# Patient Record
Sex: Female | Born: 1975 | Race: White | Hispanic: No | Marital: Married | Smoking: Never smoker
Health system: Southern US, Community
[De-identification: ages and names within clinical notes are randomized; demographics above are authoritative.]

## PROBLEM LIST (undated history)

## (undated) DIAGNOSIS — O24419 Gestational diabetes mellitus in pregnancy, unspecified control: Secondary | ICD-10-CM

## (undated) DIAGNOSIS — O09529 Supervision of elderly multigravida, unspecified trimester: Secondary | ICD-10-CM

## (undated) DIAGNOSIS — T4145XA Adverse effect of unspecified anesthetic, initial encounter: Secondary | ICD-10-CM

## (undated) DIAGNOSIS — T8859XA Other complications of anesthesia, initial encounter: Secondary | ICD-10-CM

## (undated) HISTORY — PX: NO PAST SURGERIES: SHX2092

---

## 2003-01-18 DIAGNOSIS — O09299 Supervision of pregnancy with other poor reproductive or obstetric history, unspecified trimester: Secondary | ICD-10-CM

## 2005-01-16 ENCOUNTER — Inpatient Hospital Stay: Payer: Self-pay

## 2009-11-28 ENCOUNTER — Encounter: Payer: Self-pay | Admitting: Obstetrics and Gynecology

## 2010-02-11 ENCOUNTER — Encounter: Payer: Self-pay | Admitting: Pediatric Cardiology

## 2010-02-19 IMAGING — US US OB NUCHAL TRANSLUCENCY 1ST GEST - [facility] NRPT
1 series · 14 of 23 positions shown · non-contrast
Comparison: none

[Series 1: us ob nuchal translucency 1st gest - [facility] nrpt · 14 of 23 slices shown]
[im 1/23]
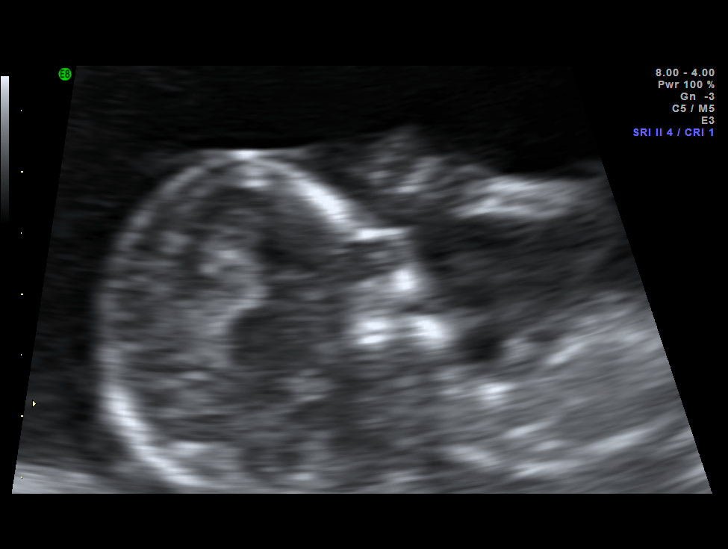
[im 3/23]
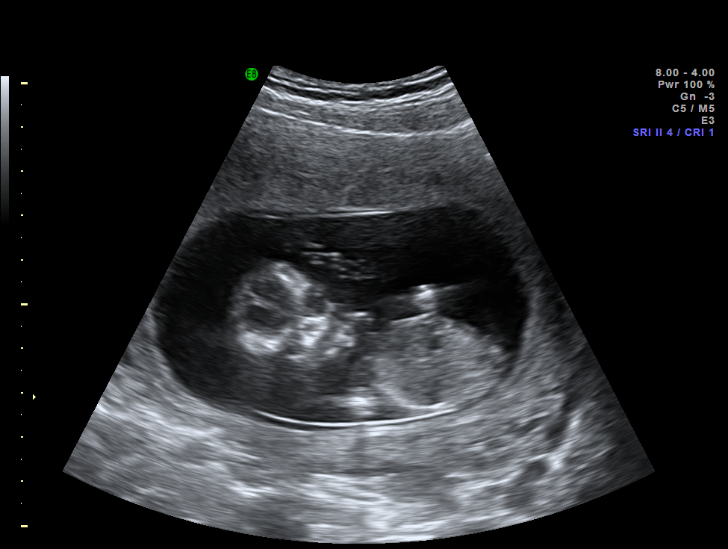
[im 5/23]
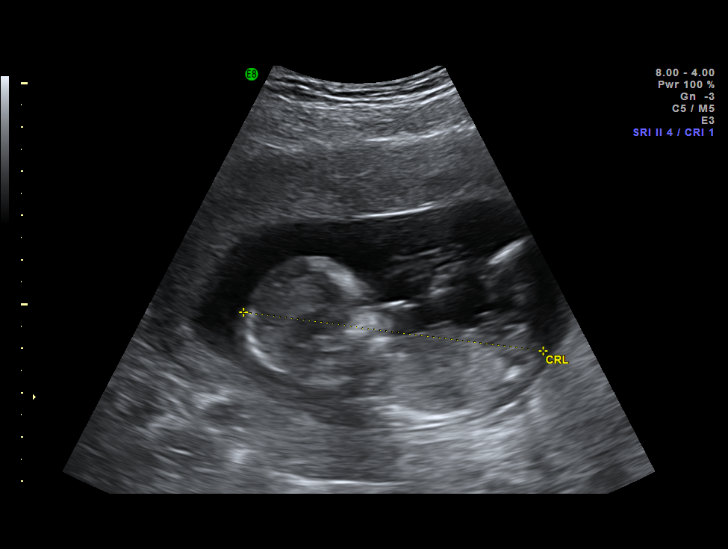
[im 6/23]
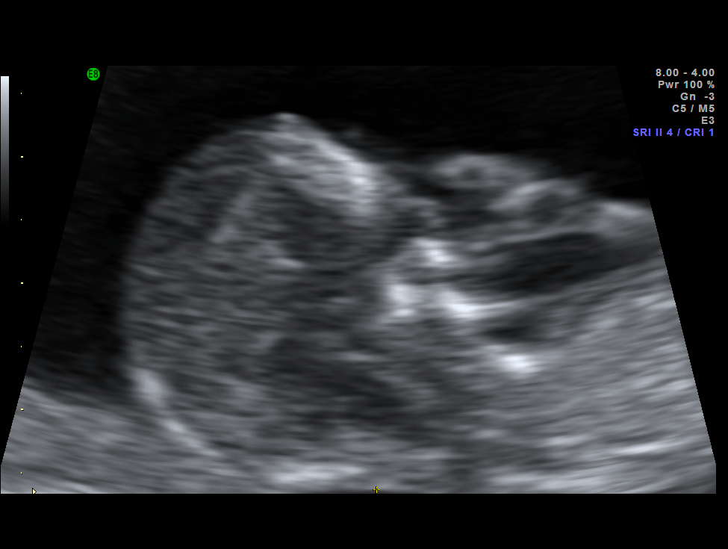
[im 8/23]
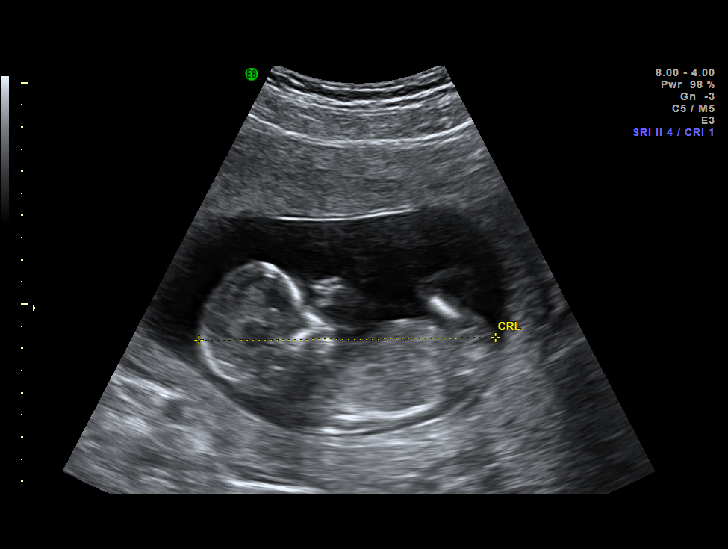
[im 10/23]
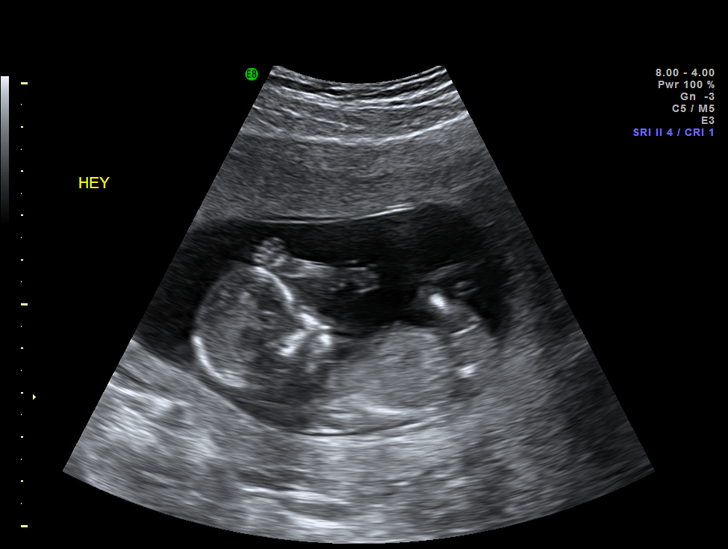
[im 11/23]
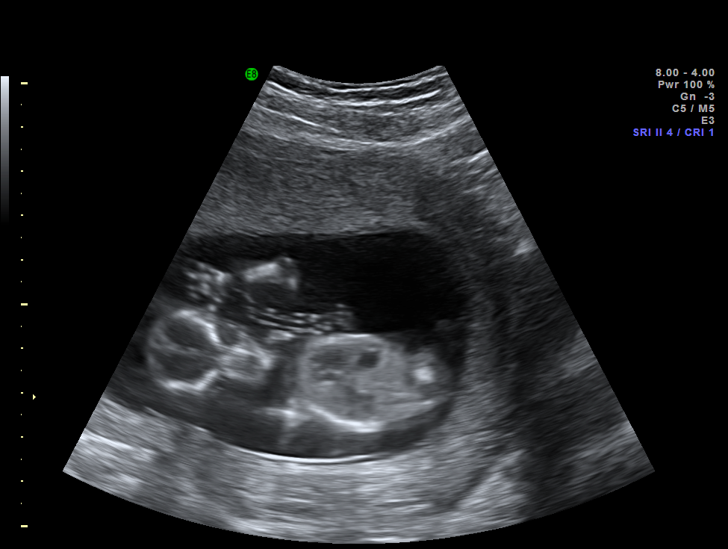
[im 13/23]
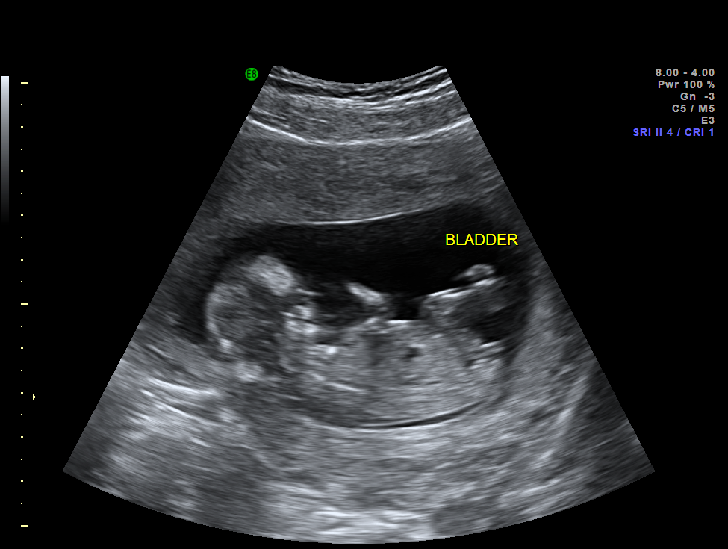
[im 14/23]
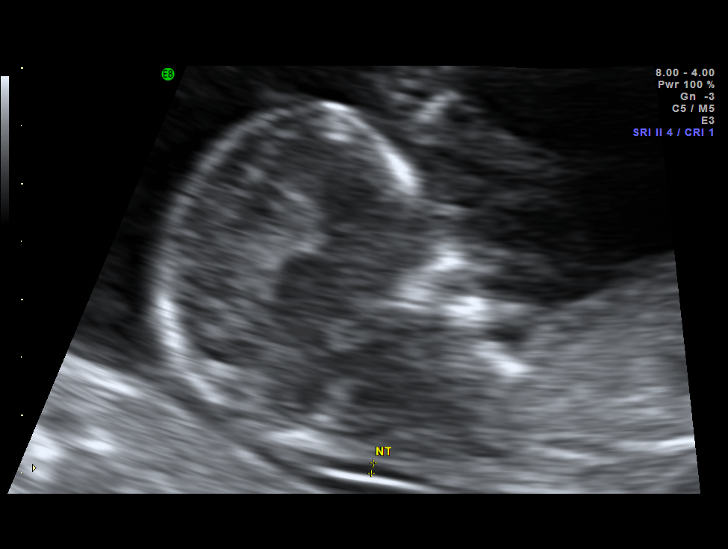
[im 16/23]
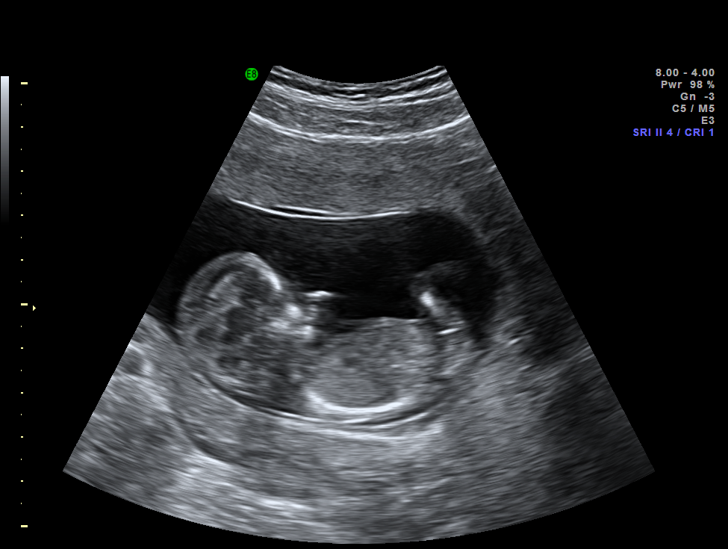
[im 18/23]
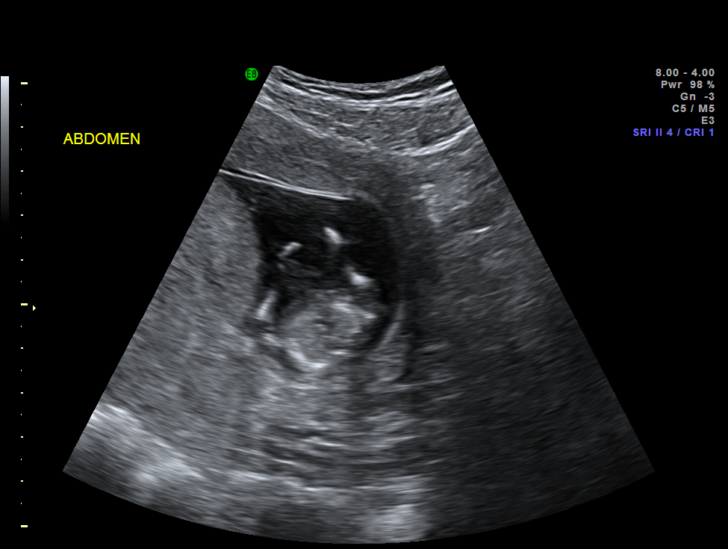
[im 19/23]
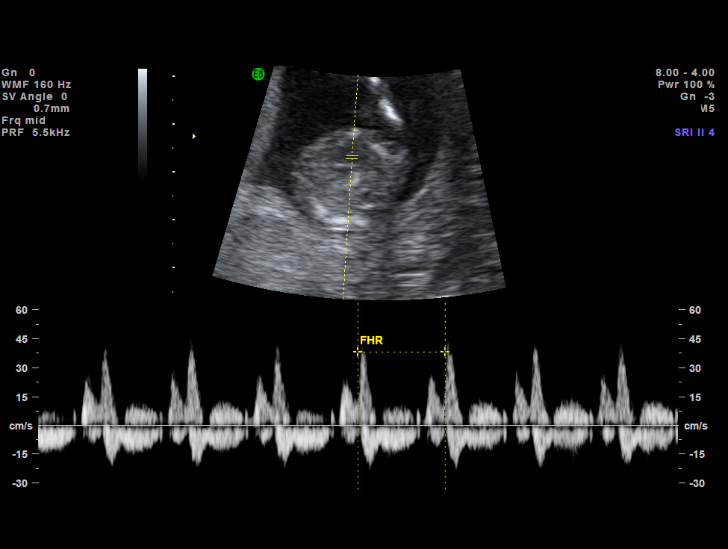
[im 21/23]
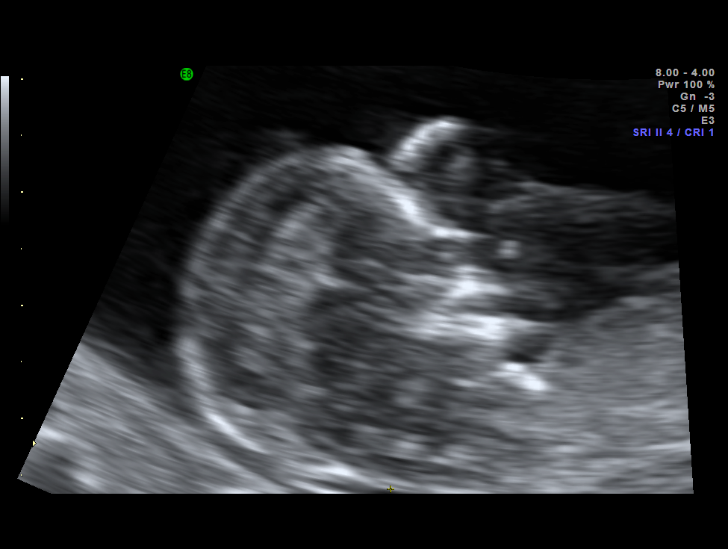
[im 23/23]
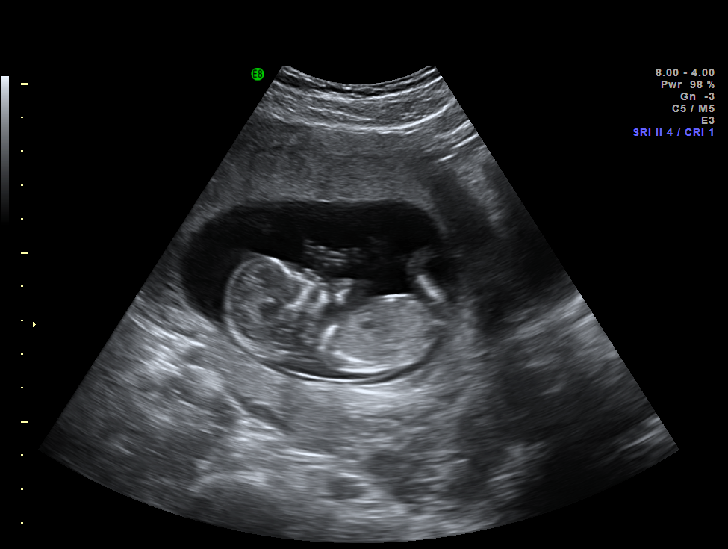

[14 of 23 positions shown; findings below may reference images not displayed]

IMAGES IMPORTED FROM THE SYNGO WORKFLOW SYSTEM
NO DICTATION FOR STUDY

## 2010-03-04 ENCOUNTER — Encounter: Payer: Self-pay | Admitting: Pediatric Cardiology

## 2010-04-14 ENCOUNTER — Ambulatory Visit: Payer: Self-pay

## 2010-05-06 ENCOUNTER — Observation Stay: Payer: Self-pay | Admitting: Obstetrics and Gynecology

## 2010-05-10 ENCOUNTER — Observation Stay: Payer: Self-pay | Admitting: Obstetrics and Gynecology

## 2010-05-21 ENCOUNTER — Inpatient Hospital Stay: Payer: Self-pay | Admitting: Obstetrics and Gynecology

## 2010-05-21 DIAGNOSIS — O09299 Supervision of pregnancy with other poor reproductive or obstetric history, unspecified trimester: Secondary | ICD-10-CM

## 2011-10-13 ENCOUNTER — Ambulatory Visit: Payer: Self-pay | Admitting: Obstetrics and Gynecology

## 2012-01-04 IMAGING — MG MAM DGTL DIAGNOSTIC MAMMO W/CAD
1 series · 6 of 6 positions shown · non-contrast
Comparison: none

REASON FOR EXAM: lt brst pain all over
COMMENTS:

[Series 6993: R CC · right · 6 of 6 slices shown]
[im 1/6]
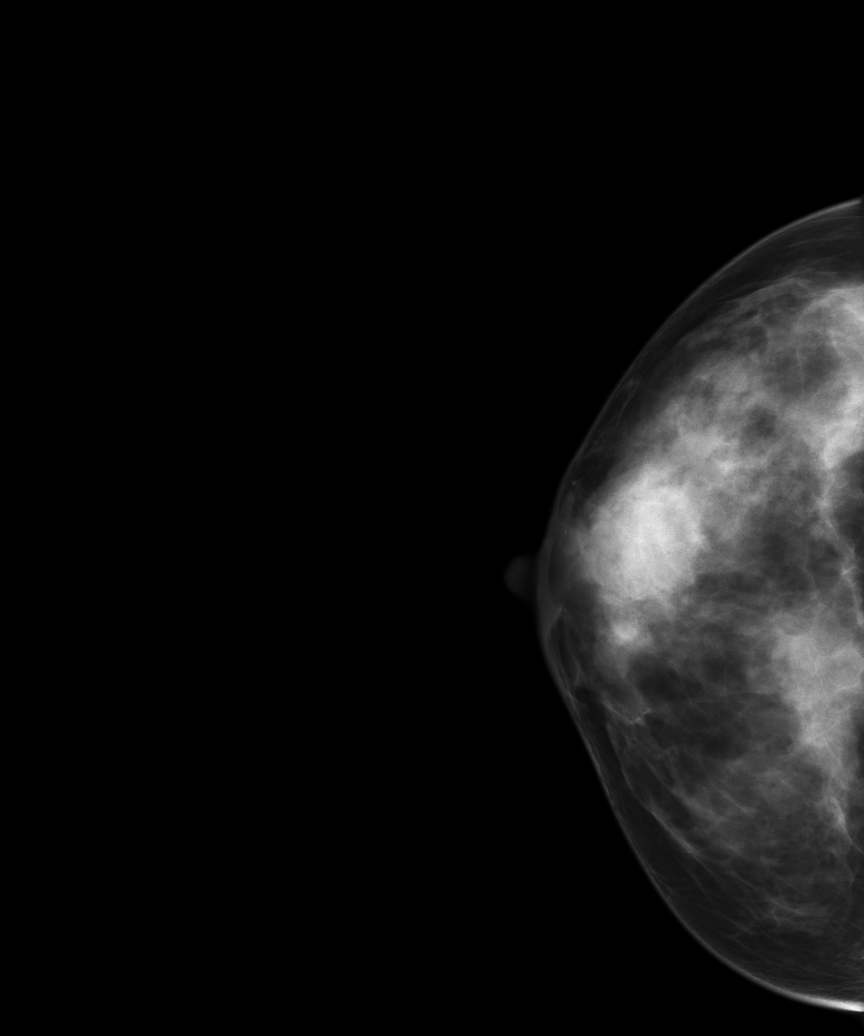
[im 2/6]
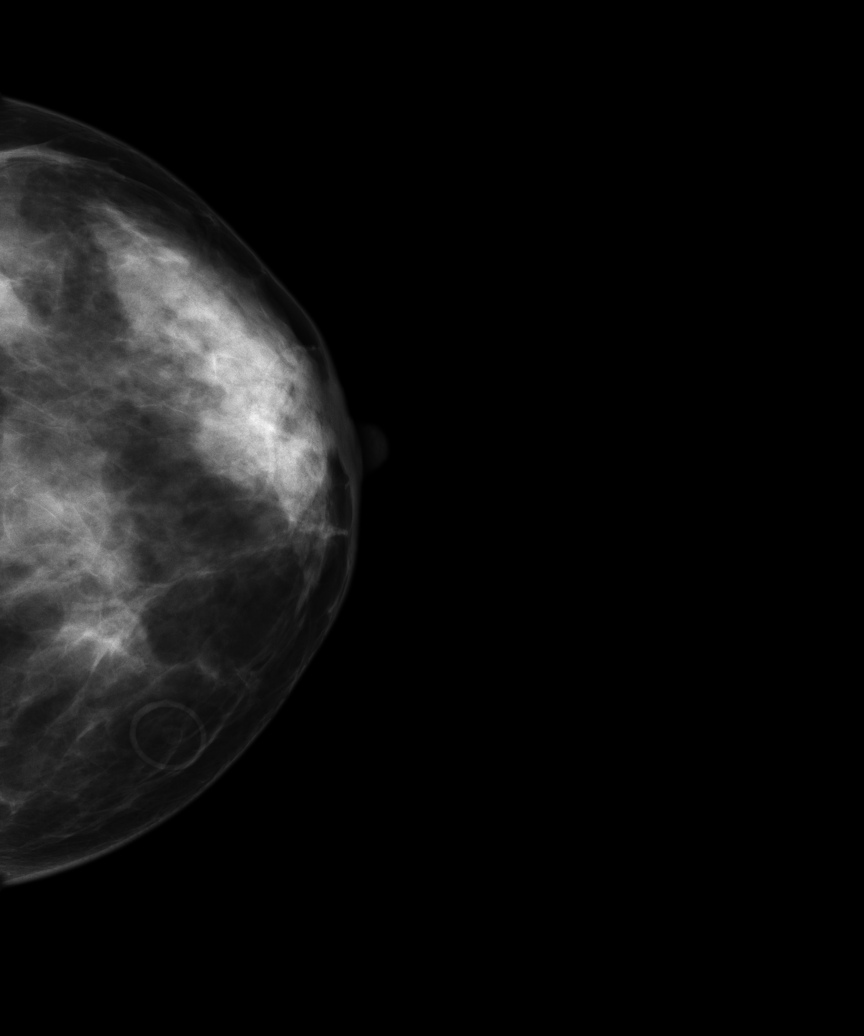
[im 3/6]
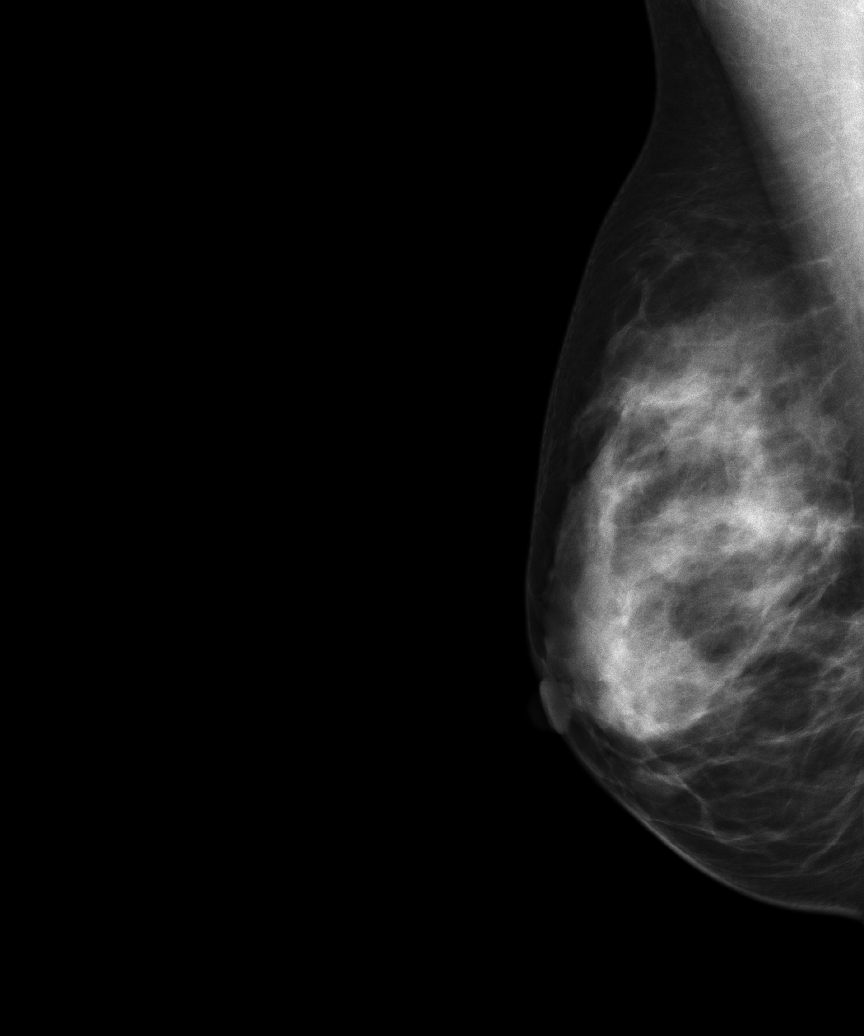
[im 4/6]
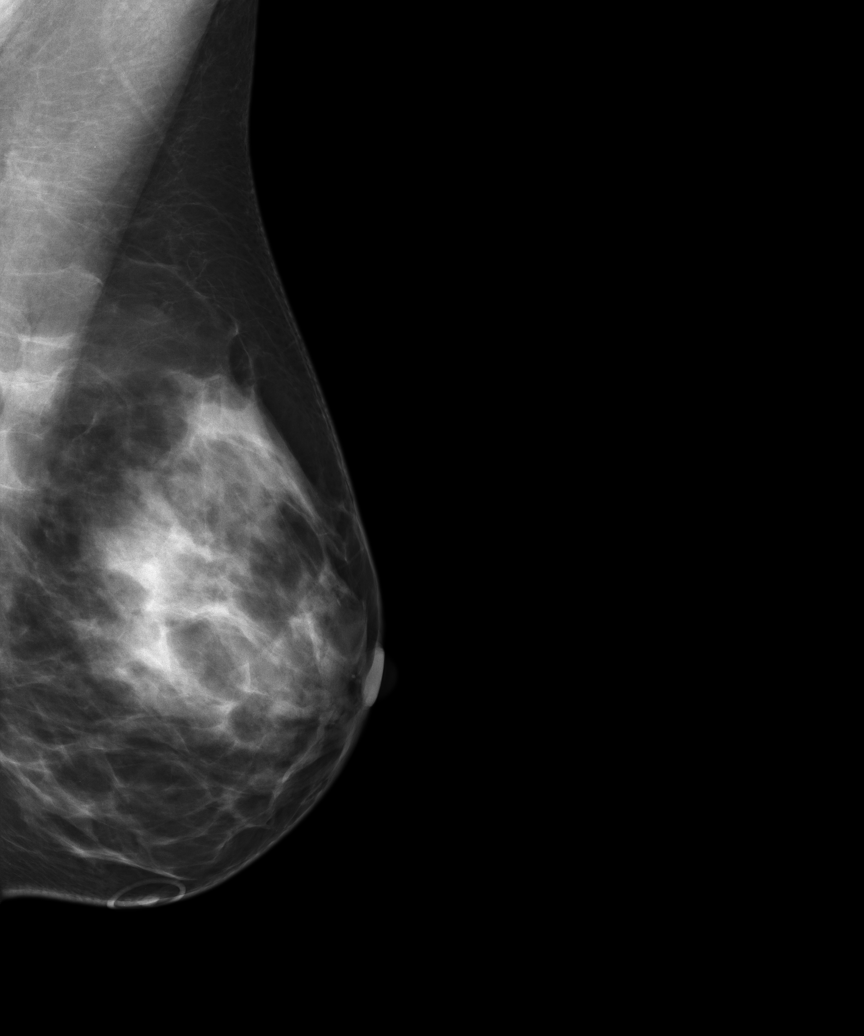
[im 5/6]
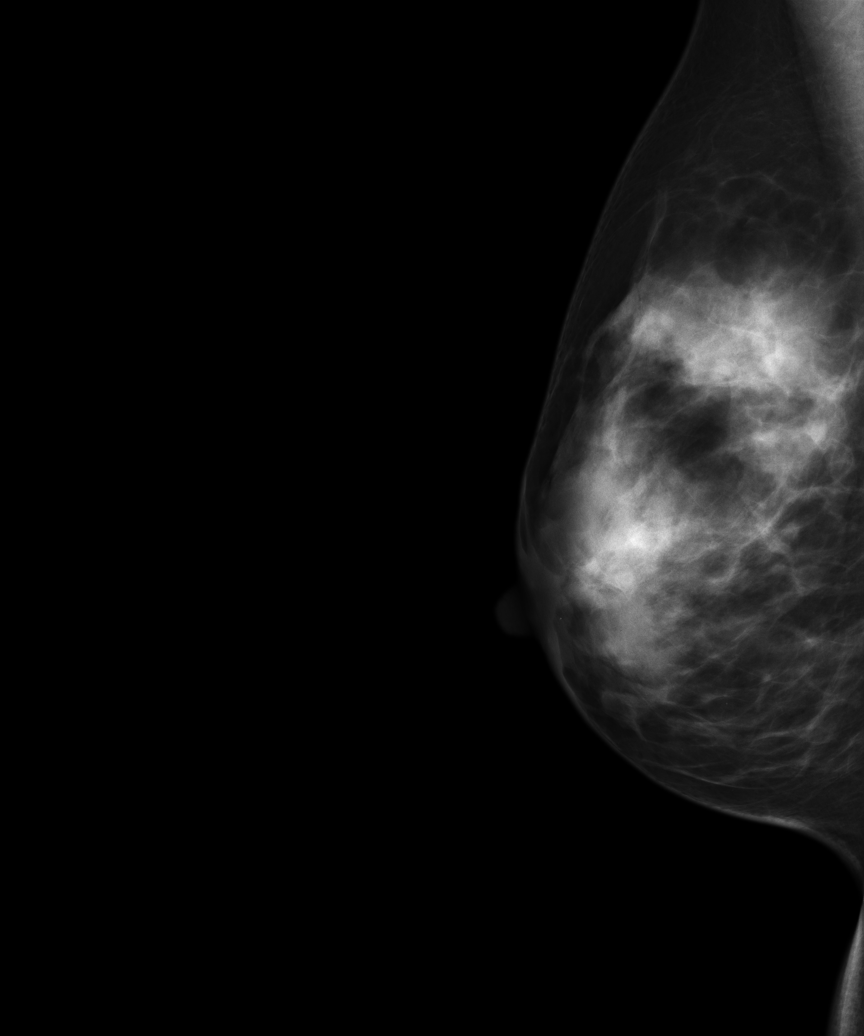
[im 6/6]
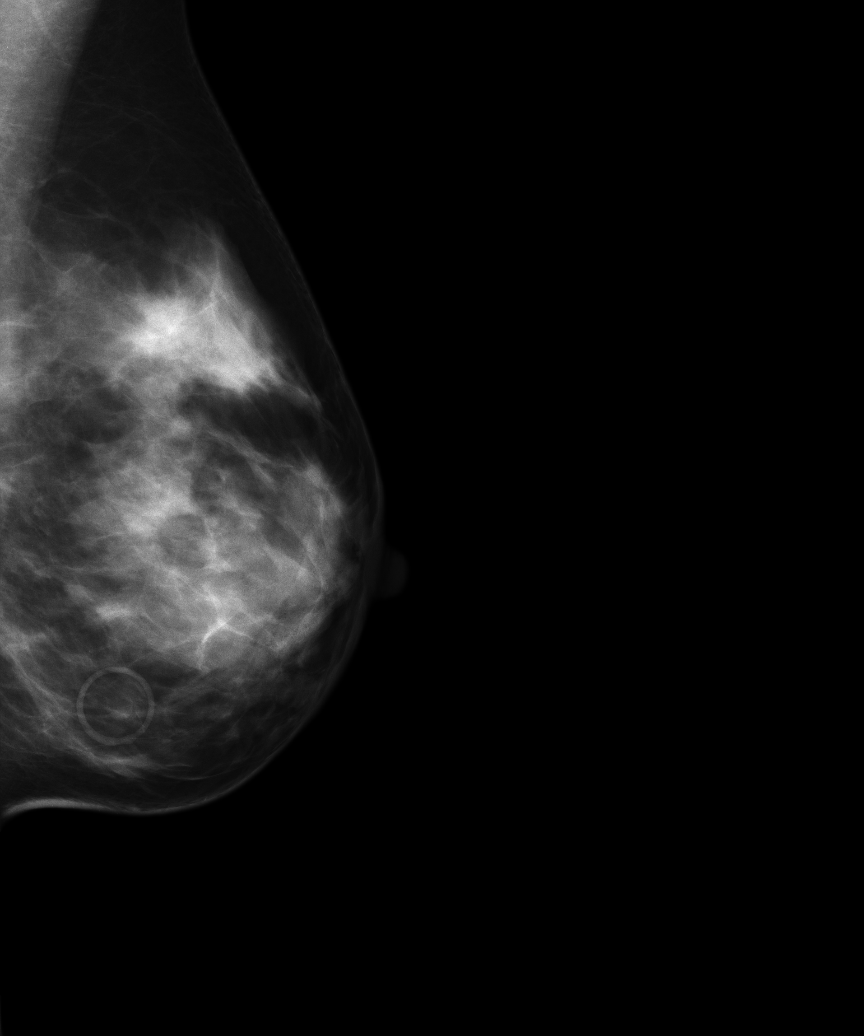

[6 of 6 positions shown; findings below may reference images not displayed]

PROCEDURE:     MAM - MAM DGTL DIAGNOSTIC MAMMO W/CAD  - October 13, 2011  [DATE]

RESULT:     This is a baseline study.

The patient is reporting generalized left breast discomfort when lying on
her stomach.

The breasts exhibit a heterogeneously dense, nodular parenchymal pattern. A
skin nevus was marked inferomedially on the left. There is no dominant mass.
There is no malignant appearing grouping of microcalcification. No area of
architectural distortion or skin thickening is seen.
IMPRESSION: 1.The breasts exhibit a normal, moderately dense, nodular parenchymal
pattern. I see no finding to suggest malignancy.

BI-RADS: Category 1 - Negative

RECOMMENDATIONS:

 Continued close clinical follow-up is recommended. If there are palpable
findings now or in the future, or if the patient's symptoms become more
focal, consideration could be given for performing a directed ultrasound of
the area of concern. Follow-up mammography in one year also could be
considered rather than waiting the typical five years until age 40.

A NEGATIVE MAMMOGRAM REPORT DOES NOT PRECLUDE BIOPSY OR OTHER EVALUATION OF
A CLINICALLY PALPABLE OR OTHERWISE SUSPICIOUS MASS OR LESION. BREAST CANCER
MAY NOT BE DETECTED BY MAMMOGRAPHY IN UP TO 10% OF CASES.

## 2014-09-26 ENCOUNTER — Ambulatory Visit: Payer: Self-pay | Admitting: Obstetrics and Gynecology

## 2014-11-23 DIAGNOSIS — O24419 Gestational diabetes mellitus in pregnancy, unspecified control: Secondary | ICD-10-CM

## 2014-11-23 HISTORY — DX: Gestational diabetes mellitus in pregnancy, unspecified control: O24.419

## 2015-07-30 ENCOUNTER — Other Ambulatory Visit: Payer: Self-pay | Admitting: Obstetrics and Gynecology

## 2015-07-30 DIAGNOSIS — Z3491 Encounter for supervision of normal pregnancy, unspecified, first trimester: Secondary | ICD-10-CM

## 2015-07-31 LAB — OB RESULTS CONSOLE HGB/HCT, BLOOD
HCT: 34 %
HEMOGLOBIN: 12.7 g/dL

## 2015-07-31 LAB — OB RESULTS CONSOLE HIV ANTIBODY (ROUTINE TESTING): HIV: NONREACTIVE

## 2015-07-31 LAB — OB RESULTS CONSOLE PLATELET COUNT: Platelets: 177 10*3/uL

## 2015-07-31 LAB — OB RESULTS CONSOLE ABO/RH: RH Type: POSITIVE

## 2015-07-31 LAB — OB RESULTS CONSOLE RUBELLA ANTIBODY, IGM: RUBELLA: IMMUNE

## 2015-07-31 LAB — OB RESULTS CONSOLE GC/CHLAMYDIA
CHLAMYDIA, DNA PROBE: NEGATIVE
GC PROBE AMP, GENITAL: NEGATIVE

## 2015-07-31 LAB — OB RESULTS CONSOLE ANTIBODY SCREEN: Antibody Screen: NEGATIVE

## 2015-07-31 LAB — OB RESULTS CONSOLE VARICELLA ZOSTER ANTIBODY, IGG: VARICELLA IGG: IMMUNE

## 2015-08-22 ENCOUNTER — Ambulatory Visit (HOSPITAL_BASED_OUTPATIENT_CLINIC_OR_DEPARTMENT_OTHER)
Admission: RE | Admit: 2015-08-22 | Discharge: 2015-08-22 | Disposition: A | Discharge status: Home / Self Care | Payer: 59 | Source: Ambulatory Visit | Attending: Obstetrics and Gynecology | Admitting: Obstetrics and Gynecology

## 2015-08-22 ENCOUNTER — Ambulatory Visit
Admission: RE | Admit: 2015-08-22 | Discharge: 2015-08-22 | Disposition: A | Discharge status: Home / Self Care | Payer: 59 | Source: Ambulatory Visit | Attending: Obstetrics and Gynecology | Admitting: Obstetrics and Gynecology

## 2015-08-22 DIAGNOSIS — O09521 Supervision of elderly multigravida, first trimester: Secondary | ICD-10-CM

## 2015-08-22 DIAGNOSIS — Z3491 Encounter for supervision of normal pregnancy, unspecified, first trimester: Secondary | ICD-10-CM | POA: Diagnosis present

## 2015-08-22 DIAGNOSIS — Z3A12 12 weeks gestation of pregnancy: Secondary | ICD-10-CM | POA: Insufficient documentation

## 2015-08-22 DIAGNOSIS — O09529 Supervision of elderly multigravida, unspecified trimester: Secondary | ICD-10-CM | POA: Insufficient documentation

## 2015-08-22 HISTORY — DX: Adverse effect of unspecified anesthetic, initial encounter: T41.45XA

## 2015-08-22 HISTORY — DX: Supervision of elderly multigravida, unspecified trimester: O09.529

## 2015-08-22 HISTORY — DX: Other complications of anesthesia, initial encounter: T88.59XA

## 2015-08-22 HISTORY — DX: Gestational diabetes mellitus in pregnancy, unspecified control: O24.419

## 2015-08-22 LAB — US OP OB COMP LESS 14 WKS

## 2015-08-22 NOTE — Progress Notes (Signed)
Referring Provider:   [facility] Length of Consultation: 40 minute consultation  Ms. Radliff was referred to [facility] for genetic counseling because of advanced maternal age.  The patient will be 39 years old at the time of delivery.  This note summarizes the information we discussed.    We explained that the chance of a chromosome abnormality increases with maternal age.  Chromosomes and examples of chromosome problems were reviewed.  Humans typically have 46 chromosomes in each cell, with half passed through each sperm and egg.  Any change in the number or structure of chromosomes can increase the risk of problems in the physical and mental development of a pregnancy.   Based upon age of the patient, the chance of any chromosome abnormality was 1 in 42. The chance of Down syndrome, the most common chromosome problem associated with maternal age, was 1 in 77.  The risk of chromosome problems is in addition to the 3% general population risk for birth defects and mental retardation.  The greatest chance, of course, is that the baby would be born in good health.  We discussed the following prenatal screening and testing options for this pregnancy:  First trimester screening, which includes nuchal translucency ultrasound screen and first trimester maternal serum marker screening.  The nuchal translucency has approximately an 80% detection rate for Down syndrome and can be positive for other chromosome abnormalities as well as heart defects.  When combined with a maternal serum marker screening, the detection rate is up to 90% for Down syndrome and up to 97% for trisomy 18.     The chorionic villus sampling procedure is available for first trimester chromosome analysis.  This involves the withdrawal of a small amount of chorionic villi (tissue from the developing placenta).  Risk of pregnancy loss is estimated to be approximately 1 in 200 to 1 in 100 (0.5 to 1%).  There is  approximately a 1% (1 in 100) chance that the CVS chromosome results will be unclear.  Chorionic villi cannot be tested for neural tube defects.     Maternal serum marker screening, a blood test that measures pregnancy proteins, can provide risk assessments for Down syndrome, trisomy 18, and open neural tube defects (spina bifida, anencephaly). Because it does not directly examine the fetus, it cannot positively diagnose or rule out these problems.  Targeted ultrasound uses high frequency sound waves to create an image of the developing fetus.  An ultrasound is often recommended as a routine means of evaluating the pregnancy.  It is also used to screen for fetal anatomy problems (for example, a heart defect) that might be suggestive of a chromosomal or other abnormality.   Amniocentesis involves the removal of a small amount of amniotic fluid from the sac surrounding the fetus with the use of a thin needle inserted through the maternal abdomen and uterus.  Ultrasound guidance is used throughout the procedure.  Fetal cells from amniotic fluid are directly evaluated and > 99.5% of chromosome problems and > 98% of open neural tube defects can be detected. This procedure is generally performed after the 15th week of pregnancy.  The main risks to this procedure include complications leading to miscarriage in less than 1 in 200 cases (0.5%).  We also reviewed the availability of cell free fetal DNA testing from maternal blood to determine whether or not the baby may have either Down syndrome, trisomy 77, or trisomy 36.  This test utilizes a maternal blood sample and  DNA sequencing technology to isolate circulating cell free fetal DNA from maternal plasma.  The fetal DNA can then be analyzed for DNA sequences that are derived from the three most common chromosomes involved in aneuploidy, chromosomes 13, 18, and 21.  If the overall amount of DNA is greater than the expected level for any of these chromosomes,  aneuploidy is suspected.  While we do not consider it a replacement for invasive testing and karyotype analysis, a negative result from this testing would be reassuring, though not a guarantee of a normal chromosome complement for the baby.  An abnormal result is certainly suggestive of an abnormal chromosome complement, though we would still recommend CVS or amniocentesis to confirm any findings from this testing.  Cystic Fibrosis screening was also discussed with the patient. Cystic fibrosis (CF) is one of the most common genetic conditions in persons of Caucasian ancestry.  This condition occurs in approximately 1 in 2,500 Caucasian persons and results in thickened secretions in the lungs, digestive, and reproductive systems.  For a baby to be at risk for having CF, both of the parents must be carriers for this condition.  Approximately 1 in 51 Caucasian persons is a carrier for CF.  Current carrier testing looks for the most common mutations in the gene for CF and can detect approximately 90% of carriers in the Caucasian population.  This means that the carrier screening can greatly reduce, but cannot eliminate, the chance for an individual to have a child with CF.  If an individual is found to be a carrier for CF, then carrier testing would be available for the partner. As part of [place]'s newborn screening profile, all babies born in the state of [place] will have a two-tier screening process.  Specimens are first tested to determine the concentration of immunoreactive trypsinogen (IRT).  The top 5% of specimens with the highest IRT values then undergo DNA testing using a panel of over 40 common CF mutations.   We obtained a detailed family history and pregnancy history.  The family history is unremarkable for birth defects, mental retardation, recurrent pregnancy loss or known chromosome abnormalities.  The patient reported that her father and paternal grandmother both were diagnosed with  benign brain tumors.  Most brain tumors occur by chance, but some are the result of genetic syndromes.  Most of these syndromes would be expected to have other symptoms associated with them that would have brought about a medical diagnosis.  In the absence of a known genetic syndrome, there appears to be a slight increased chance for benign brain tumors in relatives of persons with them.  However, no specific genetic cause is known.  The father of the baby stated that his maternal aunt and maternal grandmother both had breast cancer in their 47s.  We discussed that while most cases of breast cancer occur sporadically, 5-10% of breast cancer is expected to be due to inherited genetic changes. Early age at diagnosis, multiple affected family members and people with other similar types of cancer would be suspicious for an inherited predisposition.  If additional concern arises, a referral for the family to a cancer genetic counselor could be considered.    Ms. Amy reported no complications or exposures which would be expected to increase the risk for birth defects.   After consideration of the options, Ms. Pulido elected to proceed with InformaSeq testing and an ultrasound today.  The couple declined CF carrier screening.  An ultrasound was performed at the  time of the visit.  The gestational age was consistent with 12 weeks.  Fetal anatomy could not be assessed due to early gestational age.  Please refer to the ultrasound report for details of that study. We do recommend a detailed anatomy ultrasound in the second trimester.  Ms. Lacks was encouraged to call with questions or concerns.  We can be contacted at [phone].   Cherly Anderson, MS, CGC

## 2015-08-22 NOTE — Progress Notes (Signed)
I met with the pt and agree with plan as outlined in counselors note for NIPT  Monica Griffin

## 2015-08-28 LAB — INFORMASEQ(SM) WITH XY ANALYSIS
Fetal Fraction (%):: 17.4
Fetal Number: 1
GESTATIONAL AGE AT COLLECTION: 12.5 wk
Weight: 132 [lb_av]

## 2015-08-29 ENCOUNTER — Telehealth: Payer: Self-pay | Admitting: Obstetrics and Gynecology

## 2015-08-29 NOTE — Telephone Encounter (Signed)
The patient was informed of the results of her recent InformaSeq testing (performed at [facility]) which yielded NEGATIVE results.  The patient's specimen showed DNA consistent with two copies of chromosomes 21, 18 and 13.  The sensitivity for trisomy 1, trisomy 23 and trisomy 71 using this testing are reported as 99.1%, 98.3% and 98.1% respectively.  Thus, while the results of this testing are highly accurate, they are not considered diagnostic.  Should more definitive information be desired, the patient may still consider amniocentesis.   As requested by the patient, sex chromosome analysis was included for this sample.  Results were placed in a sealed envelope and picked up by the father of the baby, per patient request. The accuracy of sex chromosome determination is >97%.  A maternal serum AFP only should be considered if screening for neural tube defects is desired.  Cherly Anderson, MS, CGC

## 2015-11-05 ENCOUNTER — Encounter: Payer: 59 | Attending: Obstetrics and Gynecology | Admitting: Dietician

## 2015-11-05 VITALS — BP 98/56 | Ht 64.0 in | Wt 140.9 lb

## 2015-11-05 DIAGNOSIS — O24419 Gestational diabetes mellitus in pregnancy, unspecified control: Secondary | ICD-10-CM | POA: Diagnosis present

## 2015-11-05 DIAGNOSIS — O24410 Gestational diabetes mellitus in pregnancy, diet controlled: Secondary | ICD-10-CM

## 2015-11-05 NOTE — Progress Notes (Signed)
Diabetes Self-Management Education  Visit Type: First/Initial  Appt. Start Time: 1025 Appt. End Time: 1225  11/05/2015  Ms. Markiah Janeway, identified by name and date of birth, is a 39 y.o. female with a diagnosis of Diabetes: Gestational Diabetes.   ASSESSMENT  Blood pressure 98/56, height  (1.626 m), weight 140 lb 14.4 oz (63.912 kg), last menstrual period 05/26/2015. Body mass index is 24.17 kg/(m^2).  Pt c/o feeling shaky just prior to appointment with BG  62 (2 hr pp-ate cold cereal and milk for breakfast)- ate only almonds after checking BG Lacks knowledge of diabetes care and diet BG's not in control Pt has instructions about starting N and R insulins but does not have prescriptions for them (?? Dosages of N & R-our note from MD with referral has different dosages for N & R from dosages on patient's paperwork) Pt has been taking Glyburide 2.5 mg one tablet TID before every meal (MD note states to take 1 tablet in AM and 2 tablets in PM)     Diabetes Self-Management Education - 11/05/15 1304    Visit Information   Visit Type First/Initial   Initial Visit   Diabetes Type Gestational Diabetes   Psychosocial Assessment   Patient Belief/Attitude about Diabetes Motivated to manage diabetes   Self-care barriers None   Other persons present Patient   Patient Concerns Glycemic Control;Healthy Lifestyle  prevent  complications with pregnancy   Special Needs None   Preferred Learning Style Hands on;Visual;Auditory   Learning Readiness Ready   What is the last grade level you completed in school? 16+   Complications   How often do you check your blood sugar? --  2-4x/day   Fasting Blood glucose range (mg/dL) 16-109   Postprandial Blood glucose range (mg/dL) --  60-454   Have you had a dilated eye exam in the past 12 months? Yes   Have you had a dental exam in the past 12 months? Yes   Are you checking your feet? Yes   How many days per week are you checking your feet? 7    Dietary Intake   Breakfast --  eats 3 meals/day-often eats cold cereal and milk for breakfast   Snack (morning) --  eats almonds, cheese stick, fruit or protein bar   Snack (afternoon) --  eats almonds, cheese stick, fruit or protein bar   Dinner --  meal time varies 6-8pm   Snack (evening) --  eats occasional bedtime snack of cereal and milk   Beverage(s) --  drinks 8+ glasses of water daily   Exercise   Exercise Type --  walks daily   How many days per week to you exercise? 7   How many minutes per day do you exercise? 30   Total minutes per week of exercise 210   Patient Education   Previous Diabetes Education Yes (please comment)  in 2004 with GDM with previous pregnancy   Disease state  --  discussed GDM and importance of tight BG control to promote healthy outcome with pregnancy and prevent complications-viewed GDM video and provided booklet on GDM   Nutrition management  Role of diet in the treatment of diabetes and the relationship between the three main macronutrients and blood glucose level;Food label reading, portion sizes and measuring food.;Carbohydrate counting   Physical activity and exercise  --  discussed role of exercise in managing diabetes and appropriate options for exercise   Medications Reviewed use of Glyburide-Taught/reviewed insulin injection, site rotation, insulin storage and  needle disposal.;Reviewed patients medication for diabetes, action, purpose, timing of dose and side effects.  instructed on mixing N and R insulins if prescribed   Acute complications Taught treatment of hypoglycemia the 15 rule-BG 62 prior to visit and pt feeling shaky-10:20am gave pt 1 glucose tablet and rechecked BG 76 at 11am-pt feeling much better; at end of visit pt c/o feeling little shaky again and rechecked BG with results 65-ate 1 glucose tablet and advised to eat lunch as soon as possible   Psychosocial adjustment Role of stress on diabetes;Identified and addressed  patients feelings and concerns about diabetes;Worked with patient to identify barriers to care and solutions;Helped patient identify a support system for diabetes management   Preconception care Pregnancy and GDM  Role of pre-pregnancy blood glucose control on the development of the fetus;Role of family planning for patients with diabetes;Reviewed with patient blood glucose goals with pregnancy   Personal strategies to promote health Lifestyle issues that need to be addressed for better diabetes care;Helped patient develop diabetes management plan for (enter comment)      Individualized Plan for Diabetes Self-Management Training:   Learning Objective:  Patient will have a greater understanding of diabetes self-management. Patient education plan is to attend individual and/or group sessions per assessed needs and concerns.   Patient Instructions  Read booklet on Gestational Diabetes Follow Gestational Meal Planning Guidelines Complete a 3 Day Food Record and bring to next appointment Check blood sugars 4 x day - before breakfast and 2 hrs after every meal and record  Bring blood sugar log to next appointment Continue to walk 30 minutes at least 5 x week if permitted by MD Ask MD for clarification on medications/insulins at next visit Next appointment    11-20-15 Eat lunch as soon as possible Take Glyburide as directed by MD  Education material provided: General Meal Planning Guidelines for Healthy Pregnancy, GDM booklet, GDM video  If problems or questions, patient to contact team via:  [phone]  Future DSME appointment:  11-20-15

## 2015-11-05 NOTE — Patient Instructions (Signed)
Read booklet on Gestational Diabetes Follow Gestational Meal Planning Guidelines Complete a 3 Day Food Record and bring to next appointment Check blood sugars 4 x day - before breakfast and 2 hrs after every meal and record  Bring blood sugar log to next appointment Continue to walk 30 minutes at least 5 x week if permitted by MD Ask MD for clarification on medications at next visit Next appointment    11-20-15

## 2015-11-11 ENCOUNTER — Telehealth: Payer: Self-pay | Admitting: Dietician

## 2015-11-11 NOTE — Telephone Encounter (Signed)
Called pt x2 at home and work numbers-no answer x2- left messages x2 on voice mails for pt to call me with update

## 2015-11-13 ENCOUNTER — Ambulatory Visit (INDEPENDENT_AMBULATORY_CARE_PROVIDER_SITE_OTHER): Payer: 59 | Admitting: Obstetrics and Gynecology

## 2015-11-13 ENCOUNTER — Encounter: Payer: Self-pay | Admitting: Obstetrics and Gynecology

## 2015-11-13 VITALS — BP 98/63 | HR 77 | Wt 142.7 lb

## 2015-11-13 DIAGNOSIS — Z3493 Encounter for supervision of normal pregnancy, unspecified, third trimester: Secondary | ICD-10-CM

## 2015-11-13 DIAGNOSIS — O24419 Gestational diabetes mellitus in pregnancy, unspecified control: Secondary | ICD-10-CM

## 2015-11-13 LAB — POCT URINALYSIS DIPSTICK
BILIRUBIN UA: NEGATIVE
Blood, UA: NEGATIVE
KETONES UA: NEGATIVE
LEUKOCYTES UA: NEGATIVE
Nitrite, UA: NEGATIVE
Spec Grav, UA: 1.015
Urobilinogen, UA: 0.2
pH, UA: 6

## 2015-11-13 MED ORDER — GLYBURIDE 2.5 MG PO TABS: 2.5000 mg | ORAL_TABLET | Freq: Two times a day (BID) | ORAL | Status: DC

## 2015-11-13 NOTE — Progress Notes (Signed)
[facility], pt has GDM has already seen Lifestyle

## 2015-11-13 NOTE — Progress Notes (Signed)
Transfer OB from [facility]- doing well except sugars are high 30% of time, is able to correlate with diet, currently taking 2.5 mg gliburide in am and 5mg  in pm. Will schedule growth scan at [redacted] weeks along with cervical length (2 previous LEEP) reviewed OB history [id number] with all three vaginal deliveries at 37 or 38 weeks due to labor or induction due to GDM.

## 2015-11-14 ENCOUNTER — Encounter: Payer: Self-pay | Admitting: *Deleted

## 2015-11-14 ENCOUNTER — Encounter: Payer: Self-pay | Admitting: Obstetrics and Gynecology

## 2015-11-20 ENCOUNTER — Encounter: Payer: 59 | Admitting: Dietician

## 2015-11-20 VITALS — BP 94/50 | Ht 64.0 in | Wt 144.0 lb

## 2015-11-20 DIAGNOSIS — O24410 Gestational diabetes mellitus in pregnancy, diet controlled: Secondary | ICD-10-CM

## 2015-11-20 DIAGNOSIS — O24419 Gestational diabetes mellitus in pregnancy, unspecified control: Secondary | ICD-10-CM | POA: Diagnosis not present

## 2015-11-20 NOTE — Progress Notes (Signed)
   Patient's BG record indicates BGs showing some fluctuation; FBGs ranging from 79-109, some of which are related to forgetting to take Glyburide.               Post-meal BGs ranging 56-209 with one additional reading of 226. Patient relates elevated BGs to overeating or stress, some related to excess carb. Intake.   Patient's food diary indicates no completed food diary; oral report of intake indicates generally good control of Carb. Intake and balanced meals.    Provided 1900kcal meal plan, and wrote individualized menus based on patient's food preferences.  Instructed patient on food safety, including avoidance of Listeriosis, and limiting mercury from fish.  Discussed importance of maintaining healthy lifestyle habits to reduce risk of Type 2 DM as well as Gestational DM with any future pregnancies.  Advised patient to use any remaining testing supplies to test some BGs after delivery, and to have BG tested ideally annually, as well as prior to attempting future pregnancies.

## 2015-11-21 ENCOUNTER — Ambulatory Visit: Payer: 59 | Admitting: Dietician

## 2015-11-27 ENCOUNTER — Ambulatory Visit (INDEPENDENT_AMBULATORY_CARE_PROVIDER_SITE_OTHER): Payer: 59 | Admitting: Obstetrics and Gynecology

## 2015-11-27 ENCOUNTER — Encounter: Payer: Self-pay | Admitting: Obstetrics and Gynecology

## 2015-11-27 VITALS — BP 103/55 | HR 76 | Wt 144.2 lb

## 2015-11-27 DIAGNOSIS — Z331 Pregnant state, incidental: Secondary | ICD-10-CM

## 2015-11-27 LAB — POCT URINALYSIS DIPSTICK
BILIRUBIN UA: NEGATIVE
GLUCOSE UA: NEGATIVE
KETONES UA: NEGATIVE
Leukocytes, UA: NEGATIVE
NITRITE UA: NEGATIVE
Protein, UA: NEGATIVE
RBC UA: NEGATIVE
Spec Grav, UA: 1.01
Urobilinogen, UA: 0.2
pH, UA: 6.5

## 2015-11-27 NOTE — Progress Notes (Signed)
ROB- pt is doing well, denies any complaints 

## 2015-11-27 NOTE — Progress Notes (Signed)
ROB- doing well, blood sugars better- to continue meds as previously directed

## 2015-12-11 ENCOUNTER — Ambulatory Visit (INDEPENDENT_AMBULATORY_CARE_PROVIDER_SITE_OTHER): Payer: 59 | Admitting: Obstetrics and Gynecology

## 2015-12-11 ENCOUNTER — Ambulatory Visit (INDEPENDENT_AMBULATORY_CARE_PROVIDER_SITE_OTHER): Payer: 59

## 2015-12-11 ENCOUNTER — Encounter: Payer: Self-pay | Admitting: Obstetrics and Gynecology

## 2015-12-11 VITALS — BP 111/57 | HR 82 | Wt 146.4 lb

## 2015-12-11 DIAGNOSIS — O24419 Gestational diabetes mellitus in pregnancy, unspecified control: Secondary | ICD-10-CM

## 2015-12-11 DIAGNOSIS — Z23 Encounter for immunization: Secondary | ICD-10-CM | POA: Diagnosis not present

## 2015-12-11 DIAGNOSIS — Z331 Pregnant state, incidental: Secondary | ICD-10-CM

## 2015-12-11 MED ORDER — TETANUS-DIPHTH-ACELL PERTUSSIS 5-2.5-18.5 LF-MCG/0.5 IM SUSP
0.5000 mL | Freq: Once | INTRAMUSCULAR | Status: AC
  Administered 2015-12-11: 0.5 mL via INTRAMUSCULAR

## 2015-12-11 NOTE — Progress Notes (Signed)
ROB- blood consent signed, tdap given, pt denies any new complaints

## 2015-12-11 NOTE — Patient Instructions (Signed)

## 2015-12-11 NOTE — Progress Notes (Signed)
Indications: Growth for GDM and cervical length for history of LEEP x 2 Findings:  Singleton intrauterine pregnancy is visualized with FHR at 163 BPM. Biometrics give an (U/S) Gestational age of [redacted] weeks 5 days, and an (U/S) EDD of 02/21/16; this correlates with the clinically established EDD of 03/01/16.  Fetal presentation is vertex, spine right lateral.  EFW: 1465 grams ( 3 lbs. 4 oz.)  67th percentile for growth. Placenta: posterior, grade 1 and remote to cervix.  AFI: adequate at 16.3 cm.  Cervical length: 3.8 cm by transvaginal measurement and 3.3 cm with 30 seconds of fundal pressure. There is funneling seen, this appears moderate.   Anatomic survey of the lateral ventricles, spine, stomach, bladder and kidneys is complete and appears normal. Gender - female.     Impression: 1. 29 week 5 day Viable Singleton Intrauterine pregnancy by U/S. 2. (U/S) EDD is consistent with Clinically established (LMP) EDD of 03/01/16 ( 10 day difference). 3. 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

## 2015-12-26 ENCOUNTER — Encounter: Payer: Self-pay | Admitting: Obstetrics and Gynecology

## 2015-12-26 ENCOUNTER — Other Ambulatory Visit: Payer: 59

## 2015-12-26 ENCOUNTER — Ambulatory Visit (INDEPENDENT_AMBULATORY_CARE_PROVIDER_SITE_OTHER): Payer: 59 | Admitting: Obstetrics and Gynecology

## 2015-12-26 VITALS — BP 111/57 | HR 77 | Wt 149.6 lb

## 2015-12-26 DIAGNOSIS — Z331 Pregnant state, incidental: Secondary | ICD-10-CM

## 2015-12-26 LAB — POCT URINALYSIS DIPSTICK
BILIRUBIN UA: NEGATIVE
Glucose, UA: NEGATIVE
Ketones, UA: NEGATIVE
Leukocytes, UA: NEGATIVE
NITRITE UA: NEGATIVE
PH UA: 6
Protein, UA: NEGATIVE
RBC UA: NEGATIVE
SPEC GRAV UA: 1.015
Urobilinogen, UA: 0.2

## 2015-12-26 MED ORDER — GLYBURIDE 5 MG PO TABS: 5.0000 mg | ORAL_TABLET | Freq: Two times a day (BID) | ORAL | Status: DC

## 2015-12-26 NOTE — Progress Notes (Signed)
ROB-b feet swelling, otherwise she is doing well

## 2015-12-26 NOTE — Progress Notes (Signed)
ROB- doing well, FBS 50%> 100 and PP 50%>120, will increase glyburide to 7.5mg  qhs and  qam. Reviewed with Dr Valentino Saxon.

## 2016-01-03 ENCOUNTER — Other Ambulatory Visit: Payer: Self-pay | Admitting: Obstetrics and Gynecology

## 2016-01-03 DIAGNOSIS — O24419 Gestational diabetes mellitus in pregnancy, unspecified control: Secondary | ICD-10-CM

## 2016-01-08 ENCOUNTER — Ambulatory Visit (INDEPENDENT_AMBULATORY_CARE_PROVIDER_SITE_OTHER): Payer: 59

## 2016-01-08 ENCOUNTER — Encounter: Payer: 59 | Admitting: Obstetrics and Gynecology

## 2016-01-08 ENCOUNTER — Ambulatory Visit (INDEPENDENT_AMBULATORY_CARE_PROVIDER_SITE_OTHER): Payer: 59 | Admitting: Obstetrics and Gynecology

## 2016-01-08 ENCOUNTER — Other Ambulatory Visit: Payer: 59

## 2016-01-08 ENCOUNTER — Encounter: Payer: Self-pay | Admitting: Obstetrics and Gynecology

## 2016-01-08 VITALS — BP 103/62 | HR 83 | Wt 152.5 lb

## 2016-01-08 DIAGNOSIS — O24419 Gestational diabetes mellitus in pregnancy, unspecified control: Secondary | ICD-10-CM | POA: Diagnosis not present

## 2016-01-08 DIAGNOSIS — Z331 Pregnant state, incidental: Secondary | ICD-10-CM

## 2016-01-08 LAB — POCT URINALYSIS DIPSTICK
BILIRUBIN UA: NEGATIVE
Glucose, UA: 500
KETONES UA: NEGATIVE
Leukocytes, UA: NEGATIVE
NITRITE UA: NEGATIVE
PH UA: 6.5
Protein, UA: NEGATIVE
RBC UA: NEGATIVE
SPEC GRAV UA: 1.01
Urobilinogen, UA: 0.2

## 2016-01-08 NOTE — Progress Notes (Signed)
ROB-pt denies any complaints 

## 2016-01-08 NOTE — Progress Notes (Signed)
Indications: Growth and AFI for GDM Findings:  Singleton intrauterine pregnancy is visualized with FHR at 169 BPM. Biometrics give an (U/S) Gestational age of [redacted] weeks 1 day and an (U/S) EDD of 02/11/16; this DOES NOT correlate with the clinically established EDD of 03/01/16.  Fetal presentation is transverse, head maternal right, spine anterior.  EFW: 2823 grams ( 6 lbs. 4 oz. ). 94th percentile for growth per Mayford Knife. Placenta: Posterior, grade 1/2. AFI: adequate at 15.6 cm.  Cervix appears long and closed at 4.1 cm.   FBS 63-128 with>70% less than 96 and PP 62-229 with > 60% less than 140

## 2016-01-24 ENCOUNTER — Ambulatory Visit (INDEPENDENT_AMBULATORY_CARE_PROVIDER_SITE_OTHER): Payer: 59 | Admitting: Obstetrics and Gynecology

## 2016-01-24 ENCOUNTER — Encounter: Payer: Self-pay | Admitting: Obstetrics and Gynecology

## 2016-01-24 ENCOUNTER — Encounter: Payer: 59 | Admitting: Obstetrics and Gynecology

## 2016-01-24 VITALS — BP 112/64 | HR 86 | Wt 153.2 lb

## 2016-01-24 DIAGNOSIS — Z331 Pregnant state, incidental: Secondary | ICD-10-CM

## 2016-01-24 LAB — POCT URINALYSIS DIPSTICK
Bilirubin, UA: NEGATIVE
Blood, UA: NEGATIVE
Glucose, UA: 500
Ketones, UA: NEGATIVE
LEUKOCYTES UA: NEGATIVE
Nitrite, UA: NEGATIVE
PH UA: 6
PROTEIN UA: NEGATIVE
SPEC GRAV UA: 1.01
UROBILINOGEN UA: 0.2

## 2016-01-24 NOTE — Progress Notes (Signed)
ROB- pt is having a lot of pressure she is very concerned that she may be dilated, would like to be checked just for reassurance

## 2016-01-24 NOTE — Progress Notes (Signed)
ROB-reports increased BHC for last few days with pressure, FBS all high and >75% PP high, to increase glybyride to 10mg  qhs and qam; to see Dr Valentino Saxonherry next week for review of sugars and consider insulin.

## 2016-01-29 ENCOUNTER — Ambulatory Visit (INDEPENDENT_AMBULATORY_CARE_PROVIDER_SITE_OTHER): Payer: 59 | Admitting: Obstetrics and Gynecology

## 2016-01-29 VITALS — BP 106/69 | HR 80 | Wt 154.4 lb

## 2016-01-29 DIAGNOSIS — O24419 Gestational diabetes mellitus in pregnancy, unspecified control: Secondary | ICD-10-CM

## 2016-01-29 LAB — POCT URINALYSIS DIPSTICK
BILIRUBIN UA: NEGATIVE
Glucose, UA: NEGATIVE
KETONES UA: NEGATIVE
LEUKOCYTES UA: NEGATIVE
Nitrite, UA: NEGATIVE
PH UA: 6
RBC UA: NEGATIVE
SPEC GRAV UA: 1.015
Urobilinogen, UA: NEGATIVE

## 2016-01-29 MED ORDER — INSULIN NPH (HUMAN) (ISOPHANE) 100 UNIT/ML ~~LOC~~ SUSP: 12.0000 [IU] | Freq: Every day | SUBCUTANEOUS | Status: DC

## 2016-01-29 MED ORDER — INSULIN REGULAR HUMAN 100 UNIT/ML IJ SOLN: 15.0000 [IU] | Freq: Every day | INTRAMUSCULAR | Status: DC

## 2016-01-29 MED ORDER — SYRINGE (DISPOSABLE) 1 ML MISC: 1.0000 | Freq: Two times a day (BID) | Status: DC

## 2016-01-29 MED ORDER — INSULIN NPH (HUMAN) (ISOPHANE) 100 UNIT/ML ~~LOC~~ SUSP: 31.0000 [IU] | Freq: Every day | SUBCUTANEOUS | Status: DC

## 2016-01-29 MED ORDER — INSULIN REGULAR HUMAN 100 UNIT/ML IJ SOLN: 12.0000 [IU] | Freq: Every day | INTRAMUSCULAR | Status: DC

## 2016-01-29 NOTE — Patient Instructions (Signed)
Breakfast: Regular insulin 15 units subcutaneous (short acting) and NPH insulin 31 units [place] (long acting) .  Dinner : Rapid-acting or regular insulin 12 units [place].  Hour of Sleep (HS): NPH insulin 12 units [place]

## 2016-01-30 ENCOUNTER — Telehealth: Payer: Self-pay | Admitting: Obstetrics and Gynecology

## 2016-01-30 NOTE — Telephone Encounter (Signed)
No, she should wait until she has both insulins to start the regimen.

## 2016-01-30 NOTE — Telephone Encounter (Signed)
PT CALLED AND SHE SAW YOU YESTERDAY ABOUT THE SUGAR LOG, AND SHE SAID THAT ONE OF THE INSULIN IS IN NOT SURE IF ITS THE FAST OR SLOW ACTING, SO SHE WASN'T SURE OF SHE NEEDED TO TAKE IT OR WAIT UNTIL SHE GETS BOTH OF THE INSULINS WHEN SHE GETS THEM, THEY TOLD HER THAT THEY MAY NOT GET THE OTHER INSULIN IN UNTIL TOMORROW, SO SHE WANTED TO KNOW IF SHE NEEDS TO WAIT OR GO AHEAD AND TAKE THE ONE SHE GOT. SHE WOULD LIKE A CALL BACK.

## 2016-01-31 ENCOUNTER — Other Ambulatory Visit: Payer: Self-pay | Admitting: Obstetrics and Gynecology

## 2016-01-31 DIAGNOSIS — O3663X0 Maternal care for excessive fetal growth, third trimester, not applicable or unspecified: Secondary | ICD-10-CM

## 2016-02-02 NOTE — Progress Notes (Signed)
Consult OB: Patient referred from [facility] due to episodic uncontrolled blood sugars. Patient currently on 10 mg BID Glyburide.  Has range of normal fasting (90s or less) and postprandials (130s or less), followed by an occasional spike of levels to 190s-250s, despite strict adherence to diabetic diet.  Discussed that patient may have better control of glucose values on insulin. Discussed risks/benefits.  To continue 4 x daily accuchecks.  Will initiate on regimen of 15R/31NPH in a.m. and 12R/12NPH in p.m. Will titrate up as needed at next visit.  RTC in 1 week.  For 36 week labs at that time. If blood sugars controlled, can continue to co-manage with [facility].  Will need IOL at 39 weeks if controlled, sooner if uncontrolled. For gowth scan in next 1-2 weeks.

## 2016-02-03 ENCOUNTER — Other Ambulatory Visit: Payer: Self-pay | Admitting: *Deleted

## 2016-02-03 DIAGNOSIS — Z331 Pregnant state, incidental: Secondary | ICD-10-CM

## 2016-02-03 DIAGNOSIS — Z113 Encounter for screening for infections with a predominantly sexual mode of transmission: Secondary | ICD-10-CM

## 2016-02-03 DIAGNOSIS — Z3685 Encounter for antenatal screening for Streptococcus B: Secondary | ICD-10-CM

## 2016-02-05 ENCOUNTER — Ambulatory Visit (INDEPENDENT_AMBULATORY_CARE_PROVIDER_SITE_OTHER): Payer: 59

## 2016-02-05 ENCOUNTER — Ambulatory Visit (INDEPENDENT_AMBULATORY_CARE_PROVIDER_SITE_OTHER): Payer: 59 | Admitting: Obstetrics and Gynecology

## 2016-02-05 ENCOUNTER — Encounter: Payer: 59 | Admitting: Obstetrics and Gynecology

## 2016-02-05 VITALS — BP 104/64 | HR 78 | Wt 157.9 lb

## 2016-02-05 DIAGNOSIS — O3663X0 Maternal care for excessive fetal growth, third trimester, not applicable or unspecified: Secondary | ICD-10-CM | POA: Diagnosis not present

## 2016-02-05 DIAGNOSIS — O289 Unspecified abnormal findings on antenatal screening of mother: Secondary | ICD-10-CM

## 2016-02-05 DIAGNOSIS — O283 Abnormal ultrasonic finding on antenatal screening of mother: Secondary | ICD-10-CM

## 2016-02-05 NOTE — Progress Notes (Signed)
ROB- cultures obtained today, pt is very worried about her baby

## 2016-02-05 NOTE — Progress Notes (Signed)
Indications: Growth for GDM Findings:  Singleton intrauterine pregnancy is visualized with FHR at 143 BPM. Biometrics give an (U/S) Gestational age of [redacted] weeks 5and an (U/S) EDD of 03/06/16; this correlates with the clinically established EDD of 03/01/13.  Fetal presentation is vertex, spine anterior.  EFW: 2911 grams ( 6 lbs 7 oz ). Placenta: Posterior, grade 2. AFI: Adequate at 12.9 cm  Anatomic survey of the kidneys, stomach, bladder and lateral ventricles appears WNL.   Of note is shortened femur at 32 weeks size. AC is at the 91st percentile. Also, there is marked edema of the fetal limbs, upper and lower, back of fetal neck and somewhat around the fetal abdomen.  Impression: 1. 35 week 5 day Viable Singleton Intrauterine pregnancy by U/S. 2. (U/S) EDD is consistent with Clinically established (LMP) EDD of 03/01/13. 3. Increased edema is seen around fetal limbs, upper and lower, fetal abdomen and fetal neck area which may be indicative of hydrops.  4. The fetal femur appears to be substantially shortened and measures at 32 weeks (GA today is 36 and 3 ).   Recommendations: 1.Clinical correlation with the patient's History and Physical Exam.   Elliott,Teresa, Rad Tech  Scan reviewed by myself, Dr Valentino Saxonherry and Dr Ardine Bjorkefransesco, will send patient for evaluation by [facility]. Counseled patient on findings and plan.

## 2016-02-06 LAB — PARVOVIRUS B19 ANTIBODY, IGG AND IGM
PARVOVIRUS B19 IGG: 2.8 {index} — AB (ref 0.0–0.8)
Parvovirus B19 IgM: 0.1 index (ref 0.0–0.8)

## 2016-02-06 LAB — RPR: RPR Ser Ql: NONREACTIVE

## 2016-02-06 LAB — CMV ANTIBODY, IGG (EIA): CMV AB - IGG: 6.2 U/mL — AB (ref 0.00–0.59)

## 2016-02-06 LAB — RUBELLA SCREEN: Rubella Antibodies, IGG: 1.66 index (ref >0.99)

## 2016-02-06 LAB — HSV 1 ANTIBODY, IGG: HSV 1 Glycoprotein G Ab, IgG: 8.85 index — ABNORMAL HIGH (ref 0.00–0.90)

## 2016-02-06 LAB — HSV 2 ANTIBODY, IGG: HSV 2 Glycoprotein G Ab, IgG: <0.91 index (ref 0.00–0.90)

## 2016-02-06 LAB — TOXOPLASMA GONDII ANTIBODY, IGG: Toxoplasma IgG Ratio: <3 IU/mL (ref 0.0–7.1)

## 2016-02-08 LAB — STREP GP B NAA: Strep Gp B NAA: NEGATIVE

## 2016-02-11 ENCOUNTER — Encounter: Payer: 59 | Admitting: Obstetrics and Gynecology

## 2016-02-11 LAB — GC/CHLAMYDIA PROBE AMP
Chlamydia trachomatis, NAA: NEGATIVE
NEISSERIA GONORRHOEAE BY PCR: NEGATIVE

## 2016-04-02 ENCOUNTER — Ambulatory Visit (INDEPENDENT_AMBULATORY_CARE_PROVIDER_SITE_OTHER): Payer: 59 | Admitting: Obstetrics and Gynecology

## 2016-04-02 ENCOUNTER — Encounter: Payer: Self-pay | Admitting: Obstetrics and Gynecology

## 2016-04-02 MED ORDER — NORETHINDRONE 0.35 MG PO TABS: 1.0000 | ORAL_TABLET | Freq: Every day | ORAL | Status: DC

## 2016-04-02 MED ORDER — GLUCOSE BLOOD VI STRP: ORAL_STRIP | Status: AC

## 2016-04-02 NOTE — Progress Notes (Signed)
  Subjective:     Monica Griffin is a 40 y.o. female who presents for a postpartum visit. She is 6 weeks postpartum following a spontaneous vaginal delivery. I have fully reviewed the prenatal and intrapartum course. The delivery was at 36 gestational weeks. Outcome: spontaneous vaginal delivery. Anesthesia: epidural. Postpartum course has been uncomplicated. Baby's course has been uncomplicated. Baby is feeding by breast. Bleeding no bleeding. Bowel function is normal. Bladder function is normal. Patient is not sexually active. Contraception method is abstinence. Postpartum depression screening: negative.  The following portions of the patient's history were reviewed and updated as appropriate: allergies, current medications, past family history, past medical history, past social history, past surgical history and problem list.  Review of Systems A comprehensive review of systems was negative.   Objective:    BP 118/78 mmHg  Pulse 88  Ht 5\' 5"  (1.651 m)  Wt 135 lb (61.236 kg)  BMI 22.47 kg/m2  LMP 05/26/2015  General:  alert, cooperative and appears stated age   Breasts:  inspection negative, no nipple discharge or bleeding, no masses or nodularity palpable  Lungs: clear to auscultation bilaterally  Heart:  regular rate and rhythm, S1, S2 normal, no murmur, click, rub or gallop  Abdomen: soft, non-tender; bowel sounds normal; no masses,  no organomegaly   Vulva:  normal  Vagina: normal vagina, no discharge, exudate, lesion, or erythema  Cervix:  multiparous appearance  Corpus: normal size, contour, position, consistency, mobility, non-tender  Adnexa:  no mass, fullness, tenderness  Rectal Exam: Not performed.        Assessment:     6 weeks postpartum exam. Pap smear not done at today's visit.   Plan:    1. Contraception: oral progesterone-only contraceptive and vasectomy 2. Breastfeeding, GDM labs obtained today. 3. Follow up in: 4 months for AE or as needed.

## 2016-04-02 NOTE — Patient Instructions (Signed)
  Place postpartum visit patient instructions here.  

## 2016-04-03 LAB — COMPREHENSIVE METABOLIC PANEL
A/G RATIO: 2.2 (ref 1.2–2.2)
ALBUMIN: 4.7 g/dL (ref 3.5–5.5)
ALK PHOS: 71 IU/L (ref 39–117)
ALT: 22 IU/L (ref 0–32)
AST: 18 IU/L (ref 0–40)
BILIRUBIN TOTAL: 0.5 mg/dL (ref 0.0–1.2)
BUN / CREAT RATIO: 14 (ref 9–23)
BUN: 11 mg/dL (ref 6–20)
CHLORIDE: 101 mmol/L (ref 96–106)
CO2: 27 mmol/L (ref 18–29)
Calcium: 9.3 mg/dL (ref 8.7–10.2)
Creatinine, Ser: 0.81 mg/dL (ref 0.57–1.00)
GFR calc non Af Amer: 92 mL/min/{1.73_m2} (ref >59)
GFR, EST AFRICAN AMERICAN: 106 mL/min/{1.73_m2} (ref >59)
GLUCOSE: 61 mg/dL — AB (ref 65–99)
Globulin, Total: 2.1 g/dL (ref 1.5–4.5)
POTASSIUM: 4.3 mmol/L (ref 3.5–5.2)
Sodium: 143 mmol/L (ref 134–144)
TOTAL PROTEIN: 6.8 g/dL (ref 6.0–8.5)

## 2016-04-03 LAB — HEMOGLOBIN A1C
Est. average glucose Bld gHb Est-mCnc: 146 mg/dL
HEMOGLOBIN A1C: 6.7 % — AB (ref 4.8–5.6)

## 2016-04-03 LAB — VITAMIN D 25 HYDROXY (VIT D DEFICIENCY, FRACTURES): Vit D, 25-Hydroxy: 33.9 ng/mL (ref 30.0–100.0)

## 2016-08-05 ENCOUNTER — Encounter: Payer: 59 | Admitting: Obstetrics and Gynecology

## 2016-08-26 ENCOUNTER — Other Ambulatory Visit: Payer: Self-pay | Admitting: Obstetrics and Gynecology

## 2016-08-26 ENCOUNTER — Ambulatory Visit (INDEPENDENT_AMBULATORY_CARE_PROVIDER_SITE_OTHER): Payer: 59 | Admitting: Obstetrics and Gynecology

## 2016-08-26 ENCOUNTER — Encounter: Payer: Self-pay | Admitting: Obstetrics and Gynecology

## 2016-08-26 VITALS — BP 102/74 | HR 82 | Ht 65.0 in | Wt 126.4 lb

## 2016-08-26 DIAGNOSIS — Z794 Long term (current) use of insulin: Secondary | ICD-10-CM | POA: Diagnosis not present

## 2016-08-26 DIAGNOSIS — Z01419 Encounter for gynecological examination (general) (routine) without abnormal findings: Secondary | ICD-10-CM | POA: Diagnosis not present

## 2016-08-26 DIAGNOSIS — E119 Type 2 diabetes mellitus without complications: Secondary | ICD-10-CM | POA: Diagnosis not present

## 2016-08-26 NOTE — Progress Notes (Signed)
Subjective:   Monica Griffin is a 40 y.o. [phone] Caucasian female here for a routine well-woman exam.  Patient's last menstrual period was 08/10/2016.    Current complaints: none PCP: Renae FicklePaul       doesn't desire labs  Social History: Sexual: heterosexual Marital Status: married Living situation: with family Occupation: homemaker Tobacco/alcohol: no tobacco use Illicit drugs: no history of illicit drug use  The following portions of the patient's history were reviewed and updated as appropriate: allergies, current medications, past family history, past medical history, past social history, past surgical history and problem list.  Past Medical History Past Medical History:  Diagnosis Date  . AMA (advanced maternal age) multigravida 35+   . Complication of anesthesia   . Gestational diabetes mellitus in pregnancy 2016    Past Surgical History Past Surgical History:  Procedure Laterality Date  . NO PAST SURGERIES      Gynecologic History [id number]  Patient's last menstrual period was 08/10/2016. Contraception: condoms Last Pap: 2015. Results were: normal   Obstetric History OB History  Gravida Para Term Preterm AB Living  4 3 3     3   SAB TAB Ectopic Multiple Live Births               # Outcome Date GA Lbr Len/2nd Weight Sex Delivery Anes PTL Lv  4 Gravida           3 Term 05/21/10     Vag-Spont        Complications: H/O gestational diabetes in prior pregnancy, currently pregnant  2 Term 01/16/05     Vag-Spont     1 Term 01/18/03     Vag-Spont        Complications: H/O gestational diabetes in prior pregnancy, currently pregnant      Current Medications Current Outpatient Prescriptions on File Prior to Visit  Medication Sig Dispense Refill  . aspirin 81 MG chewable tablet Chew 1 tablet by mouth daily. Reported on 04/02/2016    . glucose blood (ONE TOUCH ULTRA TEST) test strip Reported on 04/02/2016 (Patient not taking: Reported on 08/26/2016) 100 each 1  . insulin  NPH Human (HUMULIN N,NOVOLIN N) 100 UNIT/ML injection Inject 0.12 mLs (12 Units total) into the skin daily before breakfast. (Patient not taking: Reported on 08/26/2016) 10 mL 3  . insulin NPH Human (HUMULIN N,NOVOLIN N) 100 UNIT/ML injection Inject 0.31 mLs (31 Units total) into the skin at bedtime. (Patient not taking: Reported on 08/26/2016) 10 mL 3  . insulin regular (HUMULIN R) 100 units/mL injection Inject 0.12 mLs (12 Units total) into the skin daily. (Patient not taking: Reported on 08/26/2016) 10 mL 3  . insulin regular (NOVOLIN R,HUMULIN R) 100 units/mL injection Inject 0.15 mLs (15 Units total) into the skin daily before breakfast. (Patient not taking: Reported on 08/26/2016) 10 mL 3  . norethindrone (MICRONOR,CAMILA,ERRIN) 0.35 MG tablet Take 1 tablet (0.35 mg total) by mouth daily. (Patient not taking: Reported on 08/26/2016) 1 Package 11  . PRENATAL 28-0.8 MG TABS Take 1 tablet by mouth daily. Reported on 11/20/2015    . Syringe, Disposable, 1 ML MISC 1 Syringe by Does not apply route 2 (two) times daily. (Patient not taking: Reported on 08/26/2016) 200 each 1   No current facility-administered medications on file prior to visit.     Review of Systems Patient denies any headaches, blurred vision, shortness of breath, chest pain, abdominal pain, problems with bowel movements, urination, or intercourse.  Objective:  BP 102/74  Pulse 82   Ht 5\' 5"  (1.651 m)   Wt 126 lb 6.4 oz (57.3 kg)   LMP 08/10/2016   Breastfeeding? No   BMI 21.03 kg/m  Physical Exam  General:  Well developed, well nourished, no acute distress. She is alert and oriented x3. Skin:  Warm and dry Neck:  Midline trachea, no thyromegaly or nodules Cardiovascular: Regular rate and rhythm, no murmur heard Lungs:  Effort normal, all lung fields clear to auscultation bilaterally Breasts:  No dominant palpable mass, retraction, or nipple discharge Abdomen:  Soft, non tender, no hepatosplenomegaly or masses Pelvic:   External genitalia is normal in appearance.  The vagina is normal in appearance. The cervix is bulbous, no CMT.  Thin prep pap is done with HR HPV cotesting. Uterus is felt to be normal size, shape, and contour.  No adnexal masses or tenderness noted. Extremities:  No swelling or varicosities noted Psych:  She has a normal mood and affect  Assessment:   Healthy well-woman exam  Plan:  To continue diabetes management  With dr Renae Fickle (Endocrinologist) F/U 1 year for AE, or sooner if needed   Symphany Fleissner Suzan Nailer, CNM

## 2016-08-26 NOTE — Patient Instructions (Signed)
Preventive Care for Adults, Female A healthy lifestyle and preventive care can promote health and wellness. Preventive health guidelines for women include the following key practices.  A routine yearly physical is a good way to check with your health care provider about your health and preventive screening. It is a chance to share any concerns and updates on your health and to receive a thorough exam.  Visit your dentist for a routine exam and preventive care every 6 months. Brush your teeth twice a day and floss once a day. Good oral hygiene prevents tooth decay and gum disease.  The frequency of eye exams is based on your age, health, family medical history, use of contact lenses, and other factors. Follow your health care provider's recommendations for frequency of eye exams.  Eat a healthy diet. Foods like vegetables, fruits, whole grains, low-fat dairy products, and lean protein foods contain the nutrients you need without too many calories. Decrease your intake of foods high in solid fats, added sugars, and salt. Eat the right amount of calories for you.Get information about a proper diet from your health care provider, if necessary.  Regular physical exercise is one of the most important things you can do for your health. Most adults should get at least 150 minutes of moderate-intensity exercise (any activity that increases your heart rate and causes you to sweat) each week. In addition, most adults need muscle-strengthening exercises on 2 or more days a week.  Maintain a healthy weight. The body mass index (BMI) is a screening tool to identify possible weight problems. It provides an estimate of body fat based on height and weight. Your health care provider can find your BMI and can help you achieve or maintain a healthy weight.For adults 20 years and older:  A BMI below 18.5 is considered underweight.  A BMI of 18.5 to 24.9 is normal.  A BMI of 25 to 29.9 is considered  overweight.  A BMI of 30 and above is considered obese.  Maintain normal blood lipids and cholesterol levels by exercising and minimizing your intake of saturated fat. Eat a balanced diet with plenty of fruit and vegetables. Blood tests for lipids and cholesterol should begin at age 64 and be repeated every 5 years. If your lipid or cholesterol levels are high, you are over 50, or you are at high risk for heart disease, you may need your cholesterol levels checked more frequently.Ongoing high lipid and cholesterol levels should be treated with medicines if diet and exercise are not working.  If you smoke, find out from your health care provider how to quit. If you do not use tobacco, do not start.  Lung cancer screening is recommended for adults aged 52-80 years who are at high risk for developing lung cancer because of a history of smoking. A yearly low-dose CT scan of the lungs is recommended for people who have at least a 30-pack-year history of smoking and are a current smoker or have quit within the past 15 years. A pack year of smoking is smoking an average of 1 pack of cigarettes a day for 1 year (for example: 1 pack a day for 30 years or 2 packs a day for 15 years). Yearly screening should continue until the smoker has stopped smoking for at least 15 years. Yearly screening should be stopped for people who develop a health problem that would prevent them from having lung cancer treatment.  If you are pregnant, do not drink alcohol. If you are  breastfeeding, be very cautious about drinking alcohol. If you are not pregnant and choose to drink alcohol, do not have more than 1 drink per day. One drink is considered to be 12 ounces (355 mL) of beer, 5 ounces (148 mL) of wine, or 1.5 ounces (44 mL) of liquor.  Avoid use of street drugs. Do not share needles with anyone. Ask for help if you need support or instructions about stopping the use of drugs.  High blood pressure causes heart disease and  increases the risk of stroke. Your blood pressure should be checked at least every 1 to 2 years. Ongoing high blood pressure should be treated with medicines if weight loss and exercise do not work.  If you are 25-78 years old, ask your health care provider if you should take aspirin to prevent strokes.  Diabetes screening is done by taking a blood sample to check your blood glucose level after you have not eaten for a certain period of time (fasting). If you are not overweight and you do not have risk factors for diabetes, you should be screened once every 3 years starting at age 86. If you are overweight or obese and you are 3-87 years of age, you should be screened for diabetes every year as part of your cardiovascular risk assessment.  Breast cancer screening is essential preventive care for women. You should practice "breast self-awareness." This means understanding the normal appearance and feel of your breasts and may include breast self-examination. Any changes detected, no matter how small, should be reported to a health care provider. Women in their 66s and 30s should have a clinical breast exam (CBE) by a health care provider as part of a regular health exam every 1 to 3 years. After age 43, women should have a CBE every year. Starting at age 37, women should consider having a mammogram (breast X-ray test) every year. Women who have a family history of breast cancer should talk to their health care provider about genetic screening. Women at a high risk of breast cancer should talk to their health care providers about having an MRI and a mammogram every year.  Breast cancer gene (BRCA)-related cancer risk assessment is recommended for women who have family members with BRCA-related cancers. BRCA-related cancers include breast, ovarian, tubal, and peritoneal cancers. Having family members with these cancers may be associated with an increased risk for harmful changes (mutations) in the breast  cancer genes BRCA1 and BRCA2. Results of the assessment will determine the need for genetic counseling and BRCA1 and BRCA2 testing.  Your health care provider may recommend that you be screened regularly for cancer of the pelvic organs (ovaries, uterus, and vagina). This screening involves a pelvic examination, including checking for microscopic changes to the surface of your cervix (Pap test). You may be encouraged to have this screening done every 3 years, beginning at age 78.  For women ages 79-65, health care providers may recommend pelvic exams and Pap testing every 3 years, or they may recommend the Pap and pelvic exam, combined with testing for human papilloma virus (HPV), every 5 years. Some types of HPV increase your risk of cervical cancer. Testing for HPV may also be done on women of any age with unclear Pap test results.  Other health care providers may not recommend any screening for nonpregnant women who are considered low risk for pelvic cancer and who do not have symptoms. Ask your health care provider if a screening pelvic exam is right for  you.  If you have had past treatment for cervical cancer or a condition that could lead to cancer, you need Pap tests and screening for cancer for at least 20 years after your treatment. If Pap tests have been discontinued, your risk factors (such as having a new sexual partner) need to be reassessed to determine if screening should resume. Some women have medical problems that increase the chance of getting cervical cancer. In these cases, your health care provider may recommend more frequent screening and Pap tests.  Colorectal cancer can be detected and often prevented. Most routine colorectal cancer screening begins at the age of 50 years and continues through age 75 years. However, your health care provider may recommend screening at an earlier age if you have risk factors for colon cancer. On a yearly basis, your health care provider may provide  home test kits to check for hidden blood in the stool. Use of a small camera at the end of a tube, to directly examine the colon (sigmoidoscopy or colonoscopy), can detect the earliest forms of colorectal cancer. Talk to your health care provider about this at age 50, when routine screening begins. Direct exam of the colon should be repeated every 5-10 years through age 75 years, unless early forms of precancerous polyps or small growths are found.  People who are at an increased risk for hepatitis B should be screened for this virus. You are considered at high risk for hepatitis B if:  You were born in a country where hepatitis B occurs often. Talk with your health care provider about which countries are considered high risk.  Your parents were born in a high-risk country and you have not received a shot to protect against hepatitis B (hepatitis B vaccine).  You have HIV or AIDS.  You use needles to inject street drugs.  You live with, or have sex with, someone who has hepatitis B.  You get hemodialysis treatment.  You take certain medicines for conditions like cancer, organ transplantation, and autoimmune conditions.  Hepatitis C blood testing is recommended for all people born from 1945 through 1965 and any individual with known risks for hepatitis C.  Practice safe sex. Use condoms and avoid high-risk sexual practices to reduce the spread of sexually transmitted infections (STIs). STIs include gonorrhea, chlamydia, syphilis, trichomonas, herpes, HPV, and human immunodeficiency virus (HIV). Herpes, HIV, and HPV are viral illnesses that have no cure. They can result in disability, cancer, and death.  You should be screened for sexually transmitted illnesses (STIs) including gonorrhea and chlamydia if:  You are sexually active and are younger than 24 years.  You are older than 24 years and your health care provider tells you that you are at risk for this type of infection.  Your sexual  activity has changed since you were last screened and you are at an increased risk for chlamydia or gonorrhea. Ask your health care provider if you are at risk.  If you are at risk of being infected with HIV, it is recommended that you take a prescription medicine daily to prevent HIV infection. This is called preexposure prophylaxis (PrEP). You are considered at risk if:  You are sexually active and do not regularly use condoms or know the HIV status of your partner(s).  You take drugs by injection.  You are sexually active with a partner who has HIV.  Talk with your health care provider about whether you are at high risk of being infected with HIV. If   you choose to begin PrEP, you should first be tested for HIV. You should then be tested every 3 months for as long as you are taking PrEP.  Osteoporosis is a disease in which the bones lose minerals and strength with aging. This can result in serious bone fractures or breaks. The risk of osteoporosis can be identified using a bone density scan. Women ages 1 years and over and women at risk for fractures or osteoporosis should discuss screening with their health care providers. Ask your health care provider whether you should take a calcium supplement or vitamin D to reduce the rate of osteoporosis.  Menopause can be associated with physical symptoms and risks. Hormone replacement therapy is available to decrease symptoms and risks. You should talk to your health care provider about whether hormone replacement therapy is right for you.  Use sunscreen. Apply sunscreen liberally and repeatedly throughout the day. You should seek shade when your shadow is shorter than you. Protect yourself by wearing long sleeves, pants, a wide-brimmed hat, and sunglasses year round, whenever you are outdoors.  Once a month, do a whole body skin exam, using a mirror to look at the skin on your back. Tell your health care provider of new moles, moles that have irregular  borders, moles that are larger than a pencil eraser, or moles that have changed in shape or color.  Stay current with required vaccines (immunizations).  Influenza vaccine. All adults should be immunized every year.  Tetanus, diphtheria, and acellular pertussis (Td, Tdap) vaccine. Pregnant women should receive 1 dose of Tdap vaccine during each pregnancy. The dose should be obtained regardless of the length of time since the last dose. Immunization is preferred during the 27th-36th week of gestation. An adult who has not previously received Tdap or who does not know her vaccine status should receive 1 dose of Tdap. This initial dose should be followed by tetanus and diphtheria toxoids (Td) booster doses every 10 years. Adults with an unknown or incomplete history of completing a 3-dose immunization series with Td-containing vaccines should begin or complete a primary immunization series including a Tdap dose. Adults should receive a Td booster every 10 years.  Varicella vaccine. An adult without evidence of immunity to varicella should receive 2 doses or a second dose if she has previously received 1 dose. Pregnant females who do not have evidence of immunity should receive the first dose after pregnancy. This first dose should be obtained before leaving the health care facility. The second dose should be obtained 4-8 weeks after the first dose.  Human papillomavirus (HPV) vaccine. Females aged 13-26 years who have not received the vaccine previously should obtain the 3-dose series. The vaccine is not recommended for use in pregnant females. However, pregnancy testing is not needed before receiving a dose. If a female is found to be pregnant after receiving a dose, no treatment is needed. In that case, the remaining doses should be delayed until after the pregnancy. Immunization is recommended for any person with an immunocompromised condition through the age of 24 years if she did not get any or all doses  earlier. During the 3-dose series, the second dose should be obtained 4-8 weeks after the first dose. The third dose should be obtained 24 weeks after the first dose and 16 weeks after the second dose.  Zoster vaccine. One dose is recommended for adults aged 97 years or older unless certain conditions are present.  Measles, mumps, and rubella (MMR) vaccine. Adults born  before 1957 generally are considered immune to measles and mumps. Adults born in [birth year] or later should have 1 or more doses of MMR vaccine unless there is a contraindication to the vaccine or there is laboratory evidence of immunity to each of the three diseases. A routine second dose of MMR vaccine should be obtained at least 28 days after the first dose for students attending postsecondary schools, health care workers, or international travelers. People who received inactivated measles vaccine or an unknown type of measles vaccine during 1963-1967 should receive 2 doses of MMR vaccine. People who received inactivated mumps vaccine or an unknown type of mumps vaccine before 1979 and are at high risk for mumps infection should consider immunization with 2 doses of MMR vaccine. For females of childbearing age, rubella immunity should be determined. If there is no evidence of immunity, females who are not pregnant should be vaccinated. If there is no evidence of immunity, females who are pregnant should delay immunization until after pregnancy. Unvaccinated health care workers born before 60 who lack laboratory evidence of measles, mumps, or rubella immunity or laboratory confirmation of disease should consider measles and mumps immunization with 2 doses of MMR vaccine or rubella immunization with 1 dose of MMR vaccine.  Pneumococcal 13-valent conjugate (PCV13) vaccine. When indicated, a person who is uncertain of his immunization history and has no record of immunization should receive the PCV13 vaccine. All adults 61 years of age and older  should receive this vaccine. An adult aged 92 years or older who has certain medical conditions and has not been previously immunized should receive 1 dose of PCV13 vaccine. This PCV13 should be followed with a dose of pneumococcal polysaccharide (PPSV23) vaccine. Adults who are at high risk for pneumococcal disease should obtain the PPSV23 vaccine at least 8 weeks after the dose of PCV13 vaccine. Adults older than 40 years of age who have normal immune system function should obtain the PPSV23 vaccine dose at least 1 year after the dose of PCV13 vaccine.  Pneumococcal polysaccharide (PPSV23) vaccine. When PCV13 is also indicated, PCV13 should be obtained first. All adults aged 2 years and older should be immunized. An adult younger than age 30 years who has certain medical conditions should be immunized. Any person who resides in a nursing home or long-term care facility should be immunized. An adult smoker should be immunized. People with an immunocompromised condition and certain other conditions should receive both PCV13 and PPSV23 vaccines. People with human immunodeficiency virus (HIV) infection should be immunized as soon as possible after diagnosis. Immunization during chemotherapy or radiation therapy should be avoided. Routine use of PPSV23 vaccine is not recommended for American Indians, [place] Natives, or people younger than 65 years unless there are medical conditions that require PPSV23 vaccine. When indicated, people who have unknown immunization and have no record of immunization should receive PPSV23 vaccine. One-time revaccination 5 years after the first dose of PPSV23 is recommended for people aged 19-64 years who have chronic kidney failure, nephrotic syndrome, asplenia, or immunocompromised conditions. People who received 1-2 doses of PPSV23 before age 44 years should receive another dose of PPSV23 vaccine at age 83 years or later if at least 5 years have passed since the previous dose. Doses  of PPSV23 are not needed for people immunized with PPSV23 at or after age 20 years.  Meningococcal vaccine. Adults with asplenia or persistent complement component deficiencies should receive 2 doses of quadrivalent meningococcal conjugate (MenACWY-D) vaccine. The doses should be obtained  at least 2 months apart. Microbiologists working with certain meningococcal bacteria, [place] recruits, people at risk during an outbreak, and people who travel to or live in countries with a high rate of meningitis should be immunized. A first-year college student up through age 28 years who is living in a residence hall should receive a dose if she did not receive a dose on or after her 16th birthday. Adults who have certain high-risk conditions should receive one or more doses of vaccine.  Hepatitis A vaccine. Adults who wish to be protected from this disease, have certain high-risk conditions, work with hepatitis A-infected animals, work in hepatitis A research labs, or travel to or work in countries with a high rate of hepatitis A should be immunized. Adults who were previously unvaccinated and who anticipate close contact with an international adoptee during the first 60 days after arrival in the [place] from a country with a high rate of hepatitis A should be immunized.  Hepatitis B vaccine. Adults who wish to be protected from this disease, have certain high-risk conditions, may be exposed to blood or other infectious body fluids, are household contacts or sex partners of hepatitis B positive people, are clients or workers in certain care facilities, or travel to or work in countries with a high rate of hepatitis B should be immunized.  Haemophilus influenzae type b (Hib) vaccine. A previously unvaccinated person with asplenia or sickle cell disease or having a scheduled splenectomy should receive 1 dose of Hib vaccine. Regardless of previous immunization, a recipient of a hematopoietic stem cell transplant  should receive a 3-dose series 6-12 months after her successful transplant. Hib vaccine is not recommended for adults with HIV infection. Preventive Services / Frequency Ages 71 to 87 years  Blood pressure check.** / Every 3-5 years.  Lipid and cholesterol check.** / Every 5 years beginning at age 1.  Clinical breast exam.** / Every 3 years for women in their 3s and 31s.  BRCA-related cancer risk assessment.** / For women who have family members with a BRCA-related cancer (breast, ovarian, tubal, or peritoneal cancers).  Pap test.** / Every 2 years from ages 50 through 86. Every 3 years starting at age 87 through age 7 or 75 with a history of 3 consecutive normal Pap tests.  HPV screening.** / Every 3 years from ages 59 through ages 35 to 6 with a history of 3 consecutive normal Pap tests.  Hepatitis C blood test.** / For any individual with known risks for hepatitis C.  Skin self-exam. / Monthly.  Influenza vaccine. / Every year.  Tetanus, diphtheria, and acellular pertussis (Tdap, Td) vaccine.** / Consult your health care provider. Pregnant women should receive 1 dose of Tdap vaccine during each pregnancy. 1 dose of Td every 10 years.  Varicella vaccine.** / Consult your health care provider. Pregnant females who do not have evidence of immunity should receive the first dose after pregnancy.  HPV vaccine. / 3 doses over 6 months, if 72 and younger. The vaccine is not recommended for use in pregnant females. However, pregnancy testing is not needed before receiving a dose.  Measles, mumps, rubella (MMR) vaccine.** / You need at least 1 dose of MMR if you were born in 1957 or later. You may also need a 2nd dose. For females of childbearing age, rubella immunity should be determined. If there is no evidence of immunity, females who are not pregnant should be vaccinated. If there is no evidence of immunity, females who are  pregnant should delay immunization until after  pregnancy.  Pneumococcal 13-valent conjugate (PCV13) vaccine.** / Consult your health care provider.  Pneumococcal polysaccharide (PPSV23) vaccine.** / 1 to 2 doses if you smoke cigarettes or if you have certain conditions.  Meningococcal vaccine.** / 1 dose if you are age 87 to 44 years and a Market researcher living in a residence hall, or have one of several medical conditions, you need to get vaccinated against meningococcal disease. You may also need additional booster doses.  Hepatitis A vaccine.** / Consult your health care provider.  Hepatitis B vaccine.** / Consult your health care provider.  Haemophilus influenzae type b (Hib) vaccine.** / Consult your health care provider. Ages 86 to 38 years  Blood pressure check.** / Every year.  Lipid and cholesterol check.** / Every 5 years beginning at age 49 years.  Lung cancer screening. / Every year if you are aged 71-80 years and have a 30-pack-year history of smoking and currently smoke or have quit within the past 15 years. Yearly screening is stopped once you have quit smoking for at least 15 years or develop a health problem that would prevent you from having lung cancer treatment.  Clinical breast exam.** / Every year after age 51 years.  BRCA-related cancer risk assessment.** / For women who have family members with a BRCA-related cancer (breast, ovarian, tubal, or peritoneal cancers).  Mammogram.** / Every year beginning at age 18 years and continuing for as long as you are in good health. Consult with your health care provider.  Pap test.** / Every 3 years starting at age 63 years through age 37 or 57 years with a history of 3 consecutive normal Pap tests.  HPV screening.** / Every 3 years from ages 41 years through ages 76 to 23 years with a history of 3 consecutive normal Pap tests.  Fecal occult blood test (FOBT) of stool. / Every year beginning at age 36 years and continuing until age 51 years. You may not need  to do this test if you get a colonoscopy every 10 years.  Flexible sigmoidoscopy or colonoscopy.** / Every 5 years for a flexible sigmoidoscopy or every 10 years for a colonoscopy beginning at age 36 years and continuing until age 35 years.  Hepatitis C blood test.** / For all people born from [birth year] through 1965 and any individual with known risks for hepatitis C.  Skin self-exam. / Monthly.  Influenza vaccine. / Every year.  Tetanus, diphtheria, and acellular pertussis (Tdap/Td) vaccine.** / Consult your health care provider. Pregnant women should receive 1 dose of Tdap vaccine during each pregnancy. 1 dose of Td every 10 years.  Varicella vaccine.** / Consult your health care provider. Pregnant females who do not have evidence of immunity should receive the first dose after pregnancy.  Zoster vaccine.** / 1 dose for adults aged 73 years or older.  Measles, mumps, rubella (MMR) vaccine.** / You need at least 1 dose of MMR if you were born in 1957 or later. You may also need a second dose. For females of childbearing age, rubella immunity should be determined. If there is no evidence of immunity, females who are not pregnant should be vaccinated. If there is no evidence of immunity, females who are pregnant should delay immunization until after pregnancy.  Pneumococcal 13-valent conjugate (PCV13) vaccine.** / Consult your health care provider.  Pneumococcal polysaccharide (PPSV23) vaccine.** / 1 to 2 doses if you smoke cigarettes or if you have certain conditions.  Meningococcal vaccine.** /  Consult your health care provider.  Hepatitis A vaccine.** / Consult your health care provider.  Hepatitis B vaccine.** / Consult your health care provider.  Haemophilus influenzae type b (Hib) vaccine.** / Consult your health care provider. Ages 80 years and over  Blood pressure check.** / Every year.  Lipid and cholesterol check.** / Every 5 years beginning at age 62 years.  Lung cancer  screening. / Every year if you are aged 32-80 years and have a 30-pack-year history of smoking and currently smoke or have quit within the past 15 years. Yearly screening is stopped once you have quit smoking for at least 15 years or develop a health problem that would prevent you from having lung cancer treatment.  Clinical breast exam.** / Every year after age 61 years.  BRCA-related cancer risk assessment.** / For women who have family members with a BRCA-related cancer (breast, ovarian, tubal, or peritoneal cancers).  Mammogram.** / Every year beginning at age 39 years and continuing for as long as you are in good health. Consult with your health care provider.  Pap test.** / Every 3 years starting at age 85 years through age 74 or 72 years with 3 consecutive normal Pap tests. Testing can be stopped between 65 and 70 years with 3 consecutive normal Pap tests and no abnormal Pap or HPV tests in the past 10 years.  HPV screening.** / Every 3 years from ages 55 years through ages 67 or 77 years with a history of 3 consecutive normal Pap tests. Testing can be stopped between 65 and 70 years with 3 consecutive normal Pap tests and no abnormal Pap or HPV tests in the past 10 years.  Fecal occult blood test (FOBT) of stool. / Every year beginning at age 81 years and continuing until age 22 years. You may not need to do this test if you get a colonoscopy every 10 years.  Flexible sigmoidoscopy or colonoscopy.** / Every 5 years for a flexible sigmoidoscopy or every 10 years for a colonoscopy beginning at age 67 years and continuing until age 22 years.  Hepatitis C blood test.** / For all people born from [birth year] through 1965 and any individual with known risks for hepatitis C.  Osteoporosis screening.** / A one-time screening for women ages 8 years and over and women at risk for fractures or osteoporosis.  Skin self-exam. / Monthly.  Influenza vaccine. / Every year.  Tetanus, diphtheria, and  acellular pertussis (Tdap/Td) vaccine.** / 1 dose of Td every 10 years.  Varicella vaccine.** / Consult your health care provider.  Zoster vaccine.** / 1 dose for adults aged 56 years or older.  Pneumococcal 13-valent conjugate (PCV13) vaccine.** / Consult your health care provider.  Pneumococcal polysaccharide (PPSV23) vaccine.** / 1 dose for all adults aged 15 years and older.  Meningococcal vaccine.** / Consult your health care provider.  Hepatitis A vaccine.** / Consult your health care provider.  Hepatitis B vaccine.** / Consult your health care provider.  Haemophilus influenzae type b (Hib) vaccine.** / Consult your health care provider. ** Family history and personal history of risk and conditions may change your health care provider's recommendations.   This information is not intended to replace advice given to you by your health care provider. Make sure you discuss any questions you have with your health care provider.   Document Released: 01/05/2002 Document Revised: 11/30/2014 Document Reviewed: 04/06/2011 Elsevier Interactive Patient Education [employer].

## 2016-08-27 LAB — CYTOLOGY - PAP

## 2016-09-23 HISTORY — PX: AUGMENTATION MAMMAPLASTY: SUR837

## 2017-07-02 ENCOUNTER — Other Ambulatory Visit: Payer: Self-pay | Admitting: *Deleted

## 2017-07-02 MED ORDER — FLUCONAZOLE 150 MG PO TABS: 150.0000 mg | ORAL_TABLET | Freq: Once | ORAL | 3 refills | Status: AC

## 2017-07-02 MED ORDER — TERCONAZOLE 0.4 % VA CREA: 1.0000 | TOPICAL_CREAM | Freq: Every day | VAGINAL | 0 refills | Status: DC

## 2018-01-18 DIAGNOSIS — E119 Type 2 diabetes mellitus without complications: Secondary | ICD-10-CM | POA: Diagnosis not present

## 2018-01-18 DIAGNOSIS — R7303 Prediabetes: Secondary | ICD-10-CM | POA: Diagnosis not present

## 2018-01-18 DIAGNOSIS — E538 Deficiency of other specified B group vitamins: Secondary | ICD-10-CM | POA: Diagnosis not present

## 2018-02-11 DIAGNOSIS — E538 Deficiency of other specified B group vitamins: Secondary | ICD-10-CM | POA: Diagnosis not present

## 2018-02-11 DIAGNOSIS — E119 Type 2 diabetes mellitus without complications: Secondary | ICD-10-CM | POA: Diagnosis not present

## 2019-06-28 ENCOUNTER — Other Ambulatory Visit: Payer: Self-pay | Admitting: *Deleted

## 2019-06-28 MED ORDER — NITROFURANTOIN MONOHYD MACRO 100 MG PO CAPS: 100.0000 mg | ORAL_CAPSULE | Freq: Two times a day (BID) | ORAL | 0 refills | Status: DC

## 2019-08-22 ENCOUNTER — Telehealth: Payer: Self-pay | Admitting: Obstetrics and Gynecology

## 2019-08-22 NOTE — Telephone Encounter (Signed)
The patient called and stated that she is wanting her to schedule her mammogram know, Pt is requesting an order to be placed and also a document stating the patient is okay to schedule her mammogram prior to having her annual done with her provider. Pt requesting MyChart message/call after complete. Please advise.

## 2019-08-23 ENCOUNTER — Encounter: Payer: Self-pay | Admitting: *Deleted

## 2019-08-23 ENCOUNTER — Other Ambulatory Visit: Payer: Self-pay | Admitting: *Deleted

## 2019-08-23 DIAGNOSIS — Z1231 Encounter for screening mammogram for malignant neoplasm of breast: Secondary | ICD-10-CM

## 2019-08-23 NOTE — Telephone Encounter (Signed)
Sent pt mcm, order has been put in

## 2019-08-24 ENCOUNTER — Other Ambulatory Visit: Payer: Self-pay | Admitting: Obstetrics and Gynecology

## 2019-08-24 DIAGNOSIS — Z1231 Encounter for screening mammogram for malignant neoplasm of breast: Secondary | ICD-10-CM

## 2019-08-31 ENCOUNTER — Other Ambulatory Visit: Payer: Self-pay | Admitting: Obstetrics and Gynecology

## 2019-08-31 ENCOUNTER — Other Ambulatory Visit: Payer: Self-pay

## 2019-08-31 ENCOUNTER — Ambulatory Visit
Admission: RE | Admit: 2019-08-31 | Discharge: 2019-08-31 | Disposition: A | Discharge status: Home / Self Care | Payer: 59 | Source: Ambulatory Visit | Attending: Obstetrics and Gynecology | Admitting: Obstetrics and Gynecology

## 2019-08-31 DIAGNOSIS — Z1231 Encounter for screening mammogram for malignant neoplasm of breast: Secondary | ICD-10-CM | POA: Diagnosis present

## 2019-09-01 ENCOUNTER — Encounter: Payer: Self-pay | Admitting: Obstetrics and Gynecology

## 2019-09-01 ENCOUNTER — Ambulatory Visit (INDEPENDENT_AMBULATORY_CARE_PROVIDER_SITE_OTHER): Payer: 59 | Admitting: Obstetrics and Gynecology

## 2019-09-01 ENCOUNTER — Other Ambulatory Visit (HOSPITAL_COMMUNITY)
Admission: RE | Admit: 2019-09-01 | Discharge: 2019-09-01 | Disposition: A | Discharge status: Home / Self Care | Payer: 59 | Source: Ambulatory Visit | Attending: Obstetrics and Gynecology | Admitting: Obstetrics and Gynecology

## 2019-09-01 VITALS — BP 98/71 | HR 79 | Ht 65.0 in | Wt 132.2 lb

## 2019-09-01 DIAGNOSIS — Z1231 Encounter for screening mammogram for malignant neoplasm of breast: Secondary | ICD-10-CM | POA: Diagnosis not present

## 2019-09-01 DIAGNOSIS — R309 Painful micturition, unspecified: Secondary | ICD-10-CM

## 2019-09-01 DIAGNOSIS — Z01419 Encounter for gynecological examination (general) (routine) without abnormal findings: Secondary | ICD-10-CM | POA: Insufficient documentation

## 2019-09-01 LAB — POCT URINALYSIS DIPSTICK
Bilirubin, UA: NEGATIVE
Blood, UA: NEGATIVE
Glucose, UA: NEGATIVE
Ketones, UA: NEGATIVE
Leukocytes, UA: NEGATIVE
Nitrite, UA: NEGATIVE
Protein, UA: NEGATIVE
Spec Grav, UA: <=1.005 — AB (ref 1.010–1.025)
Urobilinogen, UA: 0.2 E.U./dL
pH, UA: 7.5 (ref 5.0–8.0)

## 2019-09-01 NOTE — Progress Notes (Signed)
Pt present for annual exam. Pt stated that she had her mammogram yesterday. Last pap 08/26/16. Pt stated having left breast pain and itching with urination. Pt declined flu vaccine.

## 2019-09-01 NOTE — Progress Notes (Signed)
Subjective:   Monica Griffin is a 43 y.o. [id number] Caucasian female here for a routine well-woman exam.  Patient's last menstrual period was 08/08/2019 (approximate).    Current complaints: vaginal odor comes and goes, and every other month menses are painful and heavy with large clots.  Left breast itches PCP: Dr Renae Fickle       Doesn't need labs  Social History: Sexual: heterosexual Marital Status: married Living situation: with family Occupation: homemaker Tobacco/alcohol: no tobacco use Illicit drugs: no history of illicit drug use  The following portions of the patient's history were reviewed and updated as appropriate: allergies, current medications, past family history, past medical history, past social history, past surgical history and problem list.  Past Medical History Past Medical History:  Diagnosis Date  . AMA (advanced maternal age) multigravida 35+   . Complication of anesthesia   . Gestational diabetes mellitus in pregnancy 2016    Past Surgical History Past Surgical History:  Procedure Laterality Date  . AUGMENTATION MAMMAPLASTY Bilateral 09/23/2016  . NO PAST SURGERIES      Gynecologic History [id number]  Patient's last menstrual period was 08/08/2019 (approximate). Contraception: condoms Last Pap: 2017. Results were: normal Last mammogram: 08/2019. Results were: normal   Obstetric History OB History  Gravida Para Term Preterm AB Living  4 3 3     3   SAB TAB Ectopic Multiple Live Births               # Outcome Date GA Lbr Len/2nd Weight Sex Delivery Anes PTL Lv  4 Term 05/21/10     Vag-Spont        Complications: H/O gestational diabetes in prior pregnancy, currently pregnant  3 Term 01/16/05     Vag-Spont     2 Term 01/18/03     Vag-Spont        Complications: H/O gestational diabetes in prior pregnancy, currently pregnant  1 Gravida             Current Medications Current Outpatient Medications on File Prior to Visit  Medication Sig Dispense  Refill  . glucose blood (ONE TOUCH ULTRA TEST) test strip Reported on 04/02/2016 100 each 1  . metFORMIN (GLUCOPHAGE) 500 MG tablet Take 500 mg by mouth 2 (two) times daily with a meal. Take 4x times a day     No current facility-administered medications on file prior to visit.     Review of Systems Patient denies any headaches, blurred vision, shortness of breath, chest pain, abdominal pain, problems with bowel movements, urination, or intercourse.  Objective:  BP 98/71   Pulse 79   Ht 5\' 5"  (1.651 m)   Wt 132 lb 3.2 oz (60 kg)   LMP 08/08/2019 (Approximate)   BMI 22.00 kg/m  Physical Exam  General:  Well developed, well nourished, no acute distress. She is alert and oriented x3. Skin:  Warm and dry Neck:  Midline trachea, no thyromegaly or nodules Cardiovascular: Regular rate and rhythm, no murmur heard Lungs:  Effort normal, all lung fields clear to auscultation bilaterally Breasts:  No dominant palpable mass, retraction, or nipple discharge Abdomen:  Soft, non tender, no hepatosplenomegaly or masses Pelvic:  External genitalia is normal in appearance.  The vagina is normal in appearance. The cervix is bulbous, no CMT.  Thin prep pap is done with HR HPV cotesting. Uterus is felt to be normal size, shape, and contour.  No adnexal masses or tenderness noted. Extremities:  No swelling or varicosities noted Psych:  She has a normal mood and affect Urinalysis    Component Value Date/Time   BILIRUBINUR neg 09/01/2019 0829   PROTEINUR Negative 09/01/2019 0829   UROBILINOGEN 0.2 09/01/2019 0829   NITRITE neg 09/01/2019 0829   LEUKOCYTESUR Negative 09/01/2019 0829   Microscopic wet-mount exam shows negative for pathogens, normal epithelial cells. Assessment:   Healthy well-woman exam DM  Plan:  Reassured of normal findings F/U 1 year for AE, or sooner if needed   Melody Rockney Ghee, CNM

## 2019-09-01 NOTE — Patient Instructions (Addendum)
Preventive Care 40-43 Years Old, Female Preventive care refers to visits with your health care provider and lifestyle choices that can promote health and wellness. This includes:  A yearly physical exam. This may also be called an annual well check.  Regular dental visits and eye exams.  Immunizations.  Screening for certain conditions.  Healthy lifestyle choices, such as eating a healthy diet, getting regular exercise, not using drugs or products that contain nicotine and tobacco, and limiting alcohol use. What can I expect for my preventive care visit? Physical exam Your health care provider will check your:  Height and weight. This may be used to calculate body mass index (BMI), which tells if you are at a healthy weight.  Heart rate and blood pressure.  Skin for abnormal spots. Counseling Your health care provider may ask you questions about your:  Alcohol, tobacco, and drug use.  Emotional well-being.  Home and relationship well-being.  Sexual activity.  Eating habits.  Work and work environment.  Method of birth control.  Menstrual cycle.  Pregnancy history. What immunizations do I need?  Influenza (flu) vaccine  This is recommended every year. Tetanus, diphtheria, and pertussis (Tdap) vaccine  You may need a Td booster every 10 years. Varicella (chickenpox) vaccine  You may need this if you have not been vaccinated. Zoster (shingles) vaccine  You may need this after age 60. Measles, mumps, and rubella (MMR) vaccine  You may need at least one dose of MMR if you were born in 1957 or later. You may also need a second dose. Pneumococcal conjugate (PCV13) vaccine  You may need this if you have certain conditions and were not previously vaccinated. Pneumococcal polysaccharide (PPSV23) vaccine  You may need one or two doses if you smoke cigarettes or if you have certain conditions. Meningococcal conjugate (MenACWY) vaccine  You may need this if you  have certain conditions. Hepatitis A vaccine  You may need this if you have certain conditions or if you travel or work in places where you may be exposed to hepatitis A. Hepatitis B vaccine  You may need this if you have certain conditions or if you travel or work in places where you may be exposed to hepatitis B. Haemophilus influenzae type b (Hib) vaccine  You may need this if you have certain conditions. Human papillomavirus (HPV) vaccine  If recommended by your health care provider, you may need three doses over 6 months. You may receive vaccines as individual doses or as more than one vaccine together in one shot (combination vaccines). Talk with your health care provider about the risks and benefits of combination vaccines. What tests do I need? Blood tests  Lipid and cholesterol levels. These may be checked every 5 years, or more frequently if you are over 50 years old.  Hepatitis C test.  Hepatitis B test. Screening  Lung cancer screening. You may have this screening every year starting at age 55 if you have a 30-pack-year history of smoking and currently smoke or have quit within the past 15 years.  Colorectal cancer screening. All adults should have this screening starting at age 50 and continuing until age 75. Your health care provider may recommend screening at age 45 if you are at increased risk. You will have tests every 1-10 years, depending on your results and the type of screening test.  Diabetes screening. This is done by checking your blood sugar (glucose) after you have not eaten for a while (fasting). You may have this   done every 1-3 years.  Mammogram. This may be done every 1-2 years. Talk with your health care provider about when you should start having regular mammograms. This may depend on whether you have a family history of breast cancer.  BRCA-related cancer screening. This may be done if you have a family history of breast, ovarian, tubal, or peritoneal  cancers.  Pelvic exam and Pap test. This may be done every 3 years starting at age 23. Starting at age 86, this may be done every 5 years if you have a Pap test in combination with an HPV test. Other tests  Sexually transmitted disease (STD) testing.  Bone density scan. This is done to screen for osteoporosis. You may have this scan if you are at high risk for osteoporosis. Follow these instructions at home: Eating and drinking  Eat a diet that includes fresh fruits and vegetables, whole grains, lean protein, and low-fat dairy.  Take vitamin and mineral supplements as recommended by your health care provider.  Do not drink alcohol if: ? Your health care provider tells you not to drink. ? You are pregnant, may be pregnant, or are planning to become pregnant.  If you drink alcohol: ? Limit how much you have to 0-1 drink a day. ? Be aware of how much alcohol is in your drink. In the [place] one drink equals one 12 oz bottle of beer (355 mL), one 5 oz glass of wine (148 mL), or one 1 oz glass of hard liquor (44 mL). Lifestyle  Take daily care of your teeth and gums.  Stay active. Exercise for at least 30 minutes on 5 or more days each week.  Do not use any products that contain nicotine or tobacco, such as cigarettes, e-cigarettes, and chewing tobacco. If you need help quitting, ask your health care provider.  If you are sexually active, practice safe sex. Use a condom or other form of birth control (contraception) in order to prevent pregnancy and STIs (sexually transmitted infections).  If told by your health care provider, take low-dose aspirin daily starting at age 27. What's next?  Visit your health care provider once a year for a well check visit.  Ask your health care provider how often you should have your eyes and teeth checked.  Stay up to date on all vaccines. This information is not intended to replace advice given to you by your health care provider. Make sure you  discuss any questions you have with your health care provider. Document Released: 12/06/2015 Document Revised: 07/21/2018 Document Reviewed: 07/21/2018 Elsevier Patient Education  2020 [place] self-awareness is knowing how your breasts look and feel. Doing breast self-awareness is important. It allows you to catch a breast problem early while it is still small and can be treated. All women should do breast self-awareness, including women who have had breast implants. Tell your doctor if you notice a change in your breasts. What you need:  A mirror.  A well-lit room. How to do a breast self-exam A breast self-exam is one way to learn what is normal for your breasts and to check for changes. To do a breast self-exam: Look for changes  1. Take off all the clothes above your waist. 2. Stand in front of a mirror in a room with good lighting. 3. Put your hands on your hips. 4. Push your hands down. 5. Look at your breasts and nipples in the mirror to see if one breast or nipple looks  different from the other. Check to see if: ? The shape of one breast is different. ? The size of one breast is different. ? There are wrinkles, dips, and bumps in one breast and not the other. 6. Look at each breast for changes in the skin, such as: ? Redness. ? Scaly areas. 7. Look for changes in your nipples, such as: ? Liquid around the nipples. ? Bleeding. ? Dimpling. ? Redness. ? A change in where the nipples are. Feel for changes  1. Lie on your back on the floor. 2. Feel each breast. To do this, follow these steps: ? Pick a breast to feel. ? Put the arm closest to that breast above your head. ? Use your other arm to feel the nipple area of your breast. Feel the area with the pads of your three middle fingers by making small circles with your fingers. For the first circle, press lightly. For the second circle, press harder. For the third circle, press even harder.  ? Keep making circles with your fingers at the different pressures as you move down your breast. Stop when you feel your ribs. ? Move your fingers a little toward the center of your body. ? Start making circles with your fingers again, this time going up until you reach your collarbone. ? Keep making up-and-down circles until you reach your armpit. Remember to keep using the three pressures. ? Feel the other breast in the same way. 3. Sit or stand in the tub or shower. 4. With soapy water on your skin, feel each breast the same way you did in step 2 when you were lying on the floor. Write down what you find Writing down what you find can help you remember what to tell your doctor. Write down:  What is normal for each breast.  Any changes you find in each breast, including: ? The kind of changes you find. ? Whether you have pain. ? Size and location of any lumps.  When you last had your menstrual period. General tips  Check your breasts every month.  If you are breastfeeding, the best time to check your breasts is after you feed your baby or after you use a breast pump.  If you get menstrual periods, the best time to check your breasts is 5-7 days after your menstrual period is over.  With time, you will become comfortable with the self-exam, and you will begin to know if there are changes in your breasts. Contact a doctor if you:  See a change in the shape or size of your breasts or nipples.  See a change in the skin of your breast or nipples, such as red or scaly skin.  Have fluid coming from your nipples that is not normal.  Find a lump or thick area that was not there before.  Have pain in your breasts.  Have any concerns about your breast health. Summary  Breast self-awareness includes looking for changes in your breasts, as well as feeling for changes within your breasts.  Breast self-awareness should be done in front of a mirror in a well-lit room.  You should  check your breasts every month. If you get menstrual periods, the best time to check your breasts is 5-7 days after your menstrual period is over.  Let your doctor know of any changes you see in your breasts, including changes in size, changes on the skin, pain or tenderness, or fluid from your nipples that is not normal. This  information is not intended to replace advice given to you by your health care provider. Make sure you discuss any questions you have with your health care provider. Document Released: 04/27/2008 Document Revised: 06/28/2018 Document Reviewed: 06/28/2018 Elsevier Patient Education  2020 [place] 66-55 Years Old, Female Preventive care refers to visits with your health care provider and lifestyle choices that can promote health and wellness. This includes:  A yearly physical exam. This may also be called an annual well check.  Regular dental visits and eye exams.  Immunizations.  Screening for certain conditions.  Healthy lifestyle choices, such as eating a healthy diet, getting regular exercise, not using drugs or products that contain nicotine and tobacco, and limiting alcohol use. What can I expect for my preventive care visit? Physical exam Your health care provider will check your:  Height and weight. This may be used to calculate body mass index (BMI), which tells if you are at a healthy weight.  Heart rate and blood pressure.  Skin for abnormal spots. Counseling Your health care provider may ask you questions about your:  Alcohol, tobacco, and drug use.  Emotional well-being.  Home and relationship well-being.  Sexual activity.  Eating habits.  Work and work Statistician.  Method of birth control.  Menstrual cycle.  Pregnancy history. What immunizations do I need?  Influenza (flu) vaccine  This is recommended every year. Tetanus, diphtheria, and pertussis (Tdap) vaccine  You may need a Td booster every 10  years. Varicella (chickenpox) vaccine  You may need this if you have not been vaccinated. Human papillomavirus (HPV) vaccine  If recommended by your health care provider, you may need three doses over 6 months. Measles, mumps, and rubella (MMR) vaccine  You may need at least one dose of MMR. You may also need a second dose. Meningococcal conjugate (MenACWY) vaccine  One dose is recommended if you are age 92-21 years and a first-year college student living in a residence hall, or if you have one of several medical conditions. You may also need additional booster doses. Pneumococcal conjugate (PCV13) vaccine  You may need this if you have certain conditions and were not previously vaccinated. Pneumococcal polysaccharide (PPSV23) vaccine  You may need one or two doses if you smoke cigarettes or if you have certain conditions. Hepatitis A vaccine  You may need this if you have certain conditions or if you travel or work in places where you may be exposed to hepatitis A. Hepatitis B vaccine  You may need this if you have certain conditions or if you travel or work in places where you may be exposed to hepatitis B. Haemophilus influenzae type b (Hib) vaccine  You may need this if you have certain conditions. You may receive vaccines as individual doses or as more than one vaccine together in one shot (combination vaccines). Talk with your health care provider about the risks and benefits of combination vaccines. What tests do I need?  Blood tests  Lipid and cholesterol levels. These may be checked every 5 years starting at age 43.  Hepatitis C test.  Hepatitis B test. Screening  Diabetes screening. This is done by checking your blood sugar (glucose) after you have not eaten for a while (fasting).  Sexually transmitted disease (STD) testing.  BRCA-related cancer screening. This may be done if you have a family history of breast, ovarian, tubal, or peritoneal cancers.  Pelvic  exam and Pap test. This may be done every 3 years  starting at age 48. Starting at age 34, this may be done every 5 years if you have a Pap test in combination with an HPV test. Talk with your health care provider about your test results, treatment options, and if necessary, the need for more tests. Follow these instructions at home: Eating and drinking   Eat a diet that includes fresh fruits and vegetables, whole grains, lean protein, and low-fat dairy.  Take vitamin and mineral supplements as recommended by your health care provider.  Do not drink alcohol if: ? Your health care provider tells you not to drink. ? You are pregnant, may be pregnant, or are planning to become pregnant.  If you drink alcohol: ? Limit how much you have to 0-1 drink a day. ? Be aware of how much alcohol is in your drink. In the [place] one drink equals one 12 oz bottle of beer (355 mL), one 5 oz glass of wine (148 mL), or one 1 oz glass of hard liquor (44 mL). Lifestyle  Take daily care of your teeth and gums.  Stay active. Exercise for at least 30 minutes on 5 or more days each week.  Do not use any products that contain nicotine or tobacco, such as cigarettes, e-cigarettes, and chewing tobacco. If you need help quitting, ask your health care provider.  If you are sexually active, practice safe sex. Use a condom or other form of birth control (contraception) in order to prevent pregnancy and STIs (sexually transmitted infections). If you plan to become pregnant, see your health care provider for a preconception visit. What's next?  Visit your health care provider once a year for a well check visit.  Ask your health care provider how often you should have your eyes and teeth checked.  Stay up to date on all vaccines. This information is not intended to replace advice given to you by your health care provider. Make sure you discuss any questions you have with your health care provider. Document Released:  01/05/2002 Document Revised: 07/21/2018 Document Reviewed: 07/21/2018 Elsevier Patient Education  2020 [employer].

## 2019-09-13 LAB — CYTOLOGY - PAP
Comment: NEGATIVE
Diagnosis: NEGATIVE
High risk HPV: NEGATIVE

## 2019-10-12 ENCOUNTER — Encounter: Payer: 59 | Admitting: Obstetrics and Gynecology

## 2021-11-26 ENCOUNTER — Other Ambulatory Visit: Payer: Self-pay | Admitting: Obstetrics and Gynecology

## 2021-11-26 DIAGNOSIS — Z1231 Encounter for screening mammogram for malignant neoplasm of breast: Secondary | ICD-10-CM

## 2021-12-18 ENCOUNTER — Other Ambulatory Visit: Payer: Self-pay

## 2021-12-18 ENCOUNTER — Ambulatory Visit
Admission: RE | Admit: 2021-12-18 | Discharge: 2021-12-18 | Disposition: A | Discharge status: Home / Self Care | Payer: 59 | Source: Ambulatory Visit | Attending: Obstetrics and Gynecology | Admitting: Obstetrics and Gynecology

## 2021-12-18 DIAGNOSIS — Z1231 Encounter for screening mammogram for malignant neoplasm of breast: Secondary | ICD-10-CM | POA: Diagnosis present

## 2022-12-02 ENCOUNTER — Other Ambulatory Visit: Payer: Self-pay

## 2022-12-02 ENCOUNTER — Other Ambulatory Visit: Payer: Self-pay | Admitting: Obstetrics and Gynecology

## 2022-12-02 DIAGNOSIS — Z1231 Encounter for screening mammogram for malignant neoplasm of breast: Secondary | ICD-10-CM

## 2022-12-22 ENCOUNTER — Ambulatory Visit
Admission: RE | Admit: 2022-12-22 | Discharge: 2022-12-22 | Disposition: A | Discharge status: Home / Self Care | Payer: 59 | Source: Ambulatory Visit | Attending: Obstetrics and Gynecology | Admitting: Obstetrics and Gynecology

## 2022-12-22 DIAGNOSIS — Z1231 Encounter for screening mammogram for malignant neoplasm of breast: Secondary | ICD-10-CM

## 2024-05-04 ENCOUNTER — Other Ambulatory Visit: Payer: Self-pay | Admitting: Obstetrics and Gynecology

## 2024-05-04 DIAGNOSIS — Z1231 Encounter for screening mammogram for malignant neoplasm of breast: Secondary | ICD-10-CM

## 2024-06-05 ENCOUNTER — Ambulatory Visit
Admission: RE | Admit: 2024-06-05 | Discharge: 2024-06-05 | Disposition: A | Discharge status: Home / Self Care | Source: Ambulatory Visit | Attending: Obstetrics and Gynecology | Admitting: Obstetrics and Gynecology

## 2024-06-05 DIAGNOSIS — Z1231 Encounter for screening mammogram for malignant neoplasm of breast: Secondary | ICD-10-CM | POA: Insufficient documentation
